# Patient Record
Sex: Male | Born: 1965 | Marital: Single | Smoking: Never smoker
Health system: Southern US, Community
[De-identification: ages and names within clinical notes are randomized; demographics above are authoritative.]

## PROBLEM LIST (undated history)

## (undated) DIAGNOSIS — R011 Cardiac murmur, unspecified: Secondary | ICD-10-CM

## (undated) DIAGNOSIS — M7711 Lateral epicondylitis, right elbow: Principal | ICD-10-CM

## (undated) HISTORY — PX: KNEE ARTHROPLASTY: SHX992

## (undated) HISTORY — PX: SHOULDER ARTHROSCOPY: SHX128

## (undated) HISTORY — DX: Cardiac murmur, unspecified: R01.1

## (undated) HISTORY — PX: WISDOM TOOTH EXTRACTION: SHX21

## (undated) HISTORY — PX: EYE SURGERY: SHX253

## (undated) HISTORY — PX: APPENDECTOMY: SHX54

## (undated) HISTORY — DX: Lateral epicondylitis, right elbow: M77.11

---

## 2013-02-27 ENCOUNTER — Encounter: Payer: Self-pay | Admitting: Gastroenterology

## 2013-03-15 ENCOUNTER — Ambulatory Visit: Payer: Self-pay | Admitting: Gastroenterology

## 2013-05-01 ENCOUNTER — Encounter: Payer: Self-pay | Admitting: Gastroenterology

## 2014-09-25 DIAGNOSIS — S83289A Other tear of lateral meniscus, current injury, unspecified knee, initial encounter: Secondary | ICD-10-CM | POA: Insufficient documentation

## 2015-04-01 ENCOUNTER — Telehealth: Payer: Self-pay | Admitting: *Deleted

## 2015-04-01 NOTE — Telephone Encounter (Signed)
Unable to reach patient at time of Pre-Visit Call.  Left message for patient to return call when available.    

## 2015-04-02 ENCOUNTER — Ambulatory Visit: Payer: Self-pay | Admitting: Medical

## 2015-04-03 ENCOUNTER — Encounter: Payer: Self-pay | Admitting: Medical

## 2015-04-03 ENCOUNTER — Ambulatory Visit (INDEPENDENT_AMBULATORY_CARE_PROVIDER_SITE_OTHER): Payer: BLUE CROSS/BLUE SHIELD | Admitting: Medical

## 2015-04-03 VITALS — BP 134/73 | HR 54 | Temp 97.9°F | Ht 69.75 in | Wt 183.2 lb

## 2015-04-03 DIAGNOSIS — T7840XA Allergy, unspecified, initial encounter: Secondary | ICD-10-CM | POA: Diagnosis not present

## 2015-04-03 MED ORDER — HYDROXYZINE HCL 25 MG PO TABS: 25.0000 mg | ORAL_TABLET | Freq: Three times a day (TID) | ORAL | Status: DC | PRN

## 2015-04-03 MED ORDER — AMMONIUM LACTATE 12 % EX LOTN: TOPICAL_LOTION | CUTANEOUS | Status: DC

## 2015-04-03 NOTE — Progress Notes (Signed)
Pre visit review using our clinic review tool, if applicable. No additional management support is needed unless otherwise documented below in the visit note. 

## 2015-04-03 NOTE — Progress Notes (Signed)
   Subjective:    Patient ID: Thomas Skinner, male    DOB: [birth year], 49 y.o.   MRN: [id number]  HPI  I have reviewed pt PMH, PSH, FH, Social History and Surgical History  Heart murmur- as a child. Not treated. Pt reports. Denies any caridiac or respirartory symptoms.   Eye surgery- Surgery to correct muscle. Did not work. Wears glasses. Appendectomy  Knee surgery- lt side acl repair.  Pt has some mild red pretibial rash on his leg. He was dong some yard work aroun the time this occurred. But also some rash in past with wearing boots in past. This has been on and off for couple of years. This is occuring year round. This occurs year round. And at times when skin is dry.  Pt upcoming out of town for 4 wks. Leaving next week.  [employer] social research officer, exercise gym, [employer] drinks 2 a day, married.     Review of Systems  Constitutional: Negative for fever, chills and fatigue.  Respiratory: Negative for cough, shortness of breath and wheezing.   Cardiovascular: Negative for chest pain and palpitations.  Gastrointestinal: Negative for abdominal pain.  Musculoskeletal: Negative for myalgias and back pain.  Skin: Positive for rash.       Itching.  Hematological: Negative for adenopathy. Does not bruise/bleed easily.    Past Medical History  Diagnosis Date  . Heart murmur     History   Social History  . Marital Status: Single    Spouse Name: N/A  . Number of Children: N/A  . Years of Education: N/A   Occupational History  . Not on file.   Social History Main Topics  . Smoking status: Never Smoker   . Smokeless tobacco: Never Used  . Alcohol Use: 0.0 oz/week    0 Standard drinks or equivalent per week     Comment: Rare occasinal one galss of wine.  . Drug Use: No  . Sexual Activity: Not on file   Other Topics Concern  . Not on file   Social History Narrative  . No narrative on file    Past Surgical History  Procedure Laterality Date  . Appendectomy    . Knee  arthroplasty      Left  . Eye surgery      No family history on file.  No Known Allergies  No current outpatient prescriptions on file prior to visit.   No current facility-administered medications on file prior to visit.    BP 134/73 mmHg  Pulse 54  Temp(Src) 97.9 F (36.6 C) (Oral)  Ht 5' 9.75" (1.772 m)  Wt 183 lb 3.2 oz (83.099 kg)  BMI 26.46 kg/m2  SpO2 100%       Objective:   Physical Exam   General- No acute distress. Pleasant patient. Neck- Full range of motion, no jvd Lungs- Clear, even and unlabored. Heart- regular rate and rhythm. Neurologic- CNII- XII grossly intact. Skin-  Lt side pretibial area faint pink rash. Mild dry skin. No warmth, no swelling. Linear red mark with faint scab above rash are.  Rt side- no obvious rash.         Assessment & Plan:

## 2015-04-03 NOTE — Assessment & Plan Note (Addendum)
Vs eczema type reaction. Moisturize with lac-hydrin. Use your triamcinolone. Will go ahead and try to get your in with dermatologist if possible within a week before you trip.   Hydroyzine for occasional severe itch.

## 2015-04-03 NOTE — Patient Instructions (Signed)
Allergic reaction Vs eczema type reaction. Moisturize with lac-hydrin. Use your triamcinolone. Will go ahead and try to get your in with dermatologist if possible within a week before you trip.   Hydroyzine for occasional severe itch.    Wellness  Within 2 months or as needed

## 2015-05-14 ENCOUNTER — Telehealth: Payer: Self-pay | Admitting: *Deleted

## 2015-05-14 NOTE — Telephone Encounter (Signed)
Unable to reach patient at time of Pre-Visit Call.  Left message for patient to return call when available.    

## 2015-05-15 ENCOUNTER — Encounter: Payer: Self-pay | Admitting: Medical

## 2015-05-15 ENCOUNTER — Ambulatory Visit (INDEPENDENT_AMBULATORY_CARE_PROVIDER_SITE_OTHER): Payer: BLUE CROSS/BLUE SHIELD | Admitting: Medical

## 2015-05-15 VITALS — BP 125/72 | HR 57 | Temp 98.9°F | Ht 69.75 in | Wt 182.6 lb

## 2015-05-15 DIAGNOSIS — N5089 Other specified disorders of the male genital organs: Secondary | ICD-10-CM

## 2015-05-15 DIAGNOSIS — Z0000 Encounter for general adult medical examination without abnormal findings: Secondary | ICD-10-CM | POA: Diagnosis not present

## 2015-05-15 DIAGNOSIS — N508 Other specified disorders of male genital organs: Secondary | ICD-10-CM

## 2015-05-15 LAB — POCT URINALYSIS DIPSTICK
Bilirubin, UA: NEGATIVE
Glucose, UA: NEGATIVE
Ketones, UA: NEGATIVE
Leukocytes, UA: NEGATIVE
NITRITE UA: NEGATIVE
PH UA: 5
PROTEIN UA: NEGATIVE
RBC UA: NEGATIVE
Spec Grav, UA: 1.015
Urobilinogen, UA: 0.2

## 2015-05-15 LAB — CBC WITH DIFFERENTIAL/PLATELET
Basophils Absolute: 0 10*3/uL (ref 0.0–0.1)
Basophils Relative: 0 % (ref 0–1)
Eosinophils Absolute: 0.2 10*3/uL (ref 0.0–0.7)
Eosinophils Relative: 3 % (ref 0–5)
HEMATOCRIT: 39.3 % (ref 39.0–52.0)
HEMOGLOBIN: 13.5 g/dL (ref 13.0–17.0)
Lymphocytes Relative: 26 % (ref 12–46)
Lymphs Abs: 1.8 10*3/uL (ref 0.7–4.0)
MCH: 33 pg (ref 26.0–34.0)
MCHC: 34.4 g/dL (ref 30.0–36.0)
MCV: 96.1 fL (ref 78.0–100.0)
MONOS PCT: 5 % (ref 3–12)
MPV: 9.9 fL (ref 8.6–12.4)
Monocytes Absolute: 0.4 10*3/uL (ref 0.1–1.0)
Neutro Abs: 4.6 10*3/uL (ref 1.7–7.7)
Neutrophils Relative %: 66 % (ref 43–77)
PLATELETS: 246 10*3/uL (ref 150–400)
RBC: 4.09 MIL/uL — ABNORMAL LOW (ref 4.22–5.81)
RDW: 12.9 % (ref 11.5–15.5)
WBC: 7 10*3/uL (ref 4.0–10.5)

## 2015-05-15 LAB — TSH: TSH: 1.639 u[IU]/mL (ref 0.350–4.500)

## 2015-05-15 MED ORDER — TETANUS-DIPHTH-ACELL PERTUSSIS 5-2.5-18.5 LF-MCG/0.5 IM SUSP
0.5000 mL | Freq: Once | INTRAMUSCULAR | Status: AC
  Administered 2015-05-15: 0.5 mL via INTRAMUSCULAR

## 2015-05-15 MED ORDER — FLUTICASONE PROPIONATE 50 MCG/ACT NA SUSP: 2.0000 | Freq: Every day | NASAL | Status: AC

## 2015-05-15 NOTE — Assessment & Plan Note (Signed)
tdap vaccine, cbc, cmp, tsh, lipid panel ua.

## 2015-05-15 NOTE — Progress Notes (Signed)
Subjective:    Patient ID: Thomas Skinner, male    DOB: [birth year], 49 y.o.   MRN: [id number]  HPI   I have reviewed pt PMH, PSH, FH, Social History and Surgical History  Government social research officer, exercise gym, [employer] 2 a day, married.  Pt appears to be late for Tdap. He will get today.  Pt advised to get flu vaccine in fall. He may decline  Pt agrees to doing labs. But declines HIV screen.  Pt states saw dermatologist 2 years ago. No suspicous lesions     Review of Systems  Constitutional: Negative for fever, chills, diaphoresis, activity change and fatigue.  HENT: Positive for congestion.        Nasal congestion since last visit. Runny nose.  Occasinal mild ear pressure sensation. Left ear.    Respiratory: Negative for cough, chest tightness and shortness of breath.   Cardiovascular: Negative for chest pain, palpitations and leg swelling.  Gastrointestinal: Negative for nausea, vomiting and abdominal pain.  Musculoskeletal: Negative for neck pain and neck stiffness.  Neurological: Negative for dizziness, tremors, seizures, syncope, facial asymmetry, speech difficulty, weakness, light-headedness, numbness and headaches.  Psychiatric/Behavioral: Negative for behavioral problems, confusion and agitation. The patient is not nervous/anxious.    Past Medical History  Diagnosis Date  . Heart murmur     History   Social History  . Marital Status: Single    Spouse Name: N/A  . Number of Children: N/A  . Years of Education: N/A   Occupational History  . Not on file.   Social History Main Topics  . Smoking status: Never Smoker   . Smokeless tobacco: Never Used  . Alcohol Use: 0.0 oz/week    0 Standard drinks or equivalent per week     Comment: Rare occasinal one galss of wine.  . Drug Use: No  . Sexual Activity: Not on file   Other Topics Concern  . Not on file   Social History Narrative    Past Surgical History  Procedure Laterality Date  . Appendectomy     . Knee arthroplasty      Left  . Eye surgery      No family history on file.  No Known Allergies  Current Outpatient Prescriptions on File Prior to Visit  Medication Sig Dispense Refill  . ammonium lactate (LAC-HYDRIN) 12 % lotion Apply to area twice day. (Patient not taking: Reported on 05/15/2015) 225 g 0  . hydrOXYzine (ATARAX/VISTARIL) 25 MG tablet Take 1 tablet (25 mg total) by mouth 3 (three) times daily as needed for itching. (Patient not taking: Reported on 05/15/2015) 21 tablet 0   No current facility-administered medications on file prior to visit.    BP 125/72 mmHg  Pulse 57  Temp(Src) 98.9 F (37.2 C) (Oral)  Ht 5' 9.75" (1.772 m)  Wt 182 lb 9.6 oz (82.827 kg)  BMI 26.38 kg/m2  SpO2 100%       Objective:   Physical Exam  General Mental Status- Alert. Orientation- Oriented x3.  Build and Nutrition- Well nourished and Well Developed.  Skin General:-Normal. Color- Normal color. Moisture- Normal. Temperature-Warm.  HEENT  Ears- Normal. Auditory Canal- Bilateral-Normal. Tympanic Membrane- Bilateral- Both mild red. Eye Fundi-Bilateral-Normal. Pupil- bilateral- Direct reaction to light normal. Nose & Sinuses-  Boggy turbinates. pnd. Nostrils-Bilateral- Normal. Mouth & Throat-Normal.  Neck Neck- No Bruits or Masses. Trachea midline.  Thyroid- Normal.  Chest and Lung Exam  Breath Sounds- Normal.  Adventitous Sounds:-No adventitious sounds.  Cardiovascular Inspection:- No  Heaves. Auscultation:-Normal sinus rhythm without murmur gallop, S1 WNL and S2 WNL.  Abdomen Inspection:-Inspection Normal. Inspection of the abdomen reveals- No hernias(pt feels like he has lump between rectus) Palpation/Percussion:- Palpation and Percussion of the Abdomen reveal- Non Tender and No Palpable abdominal masses. Liver: Other Characteristics- No hepatomegaly. Spleen:Other Characteristics- No Splenomegaly. Auscultation:- Auscultation of the abdomen reveals- Bowel sounds  normal and No Abdominal bruits.  Male Genitourinary Urethra:- No discharge. Penis- Circumcised. Scrotum- No masses. Testes- Bilateral-lt normal but epididymus mild enlarged faint tender  Rt side bottom aspect of testicle side tiny lump. Maybe appendice ot testicle    Peripheral  Vascular Lower Extremity:Inspection- Bilateral-Inspection Normal    Neurologic Mental Status:- Normal. Cranial Nerves:-Normal Bilaterally. Motor:-Normal. Strength:5/5 normal muscle strength-All Muscles. .  Musculoskeletal Global Assessment General-Joints show full range of motion without obvious deformity and Normal muscle mass. Strength in upper and lower extremities.  Lymphatics General lymphatics Description- No generalized lymphadenopathy.  Skin- very small scattered moles anterior thorax. No worrisome features      Assessment & Plan:

## 2015-05-15 NOTE — Progress Notes (Signed)
Pre visit review using our clinic review tool, if applicable. No additional management support is needed unless otherwise documented below in the visit note. 

## 2015-05-15 NOTE — Patient Instructions (Addendum)
Wellness examination tdap vaccine, cbc, cmp, tsh, lipid panel ua.    Our staff will call you with [place] date.  Rx flonase for nasal congestion.Likley allergies. If ear pain occurs notify us and give antibiotic.  Follow up date to be determined after lab results in.  Preventive Care for Adults A healthy lifestyle and preventive care can promote health and wellness. Preventive health guidelines for men include the following key practices:  A routine yearly physical is a good way to check with your health care provider about your health and preventative screening. It is a chance to share any concerns and updates on your health and to receive a thorough exam.  Visit your dentist for a routine exam and preventative care every 6 months. Brush your teeth twice a day and floss once a day. Good oral hygiene prevents tooth decay and gum disease.  The frequency of eye exams is based on your age, health, family medical history, use of contact lenses, and other factors. Follow your health care provider's recommendations for frequency of eye exams.  Eat a healthy diet. Foods such as vegetables, fruits, whole grains, low-fat dairy products, and lean protein foods contain the nutrients you need without too many calories. Decrease your intake of foods high in solid fats, added sugars, and salt. Eat the right amount of calories for you.Get information about a proper diet from your health care provider, if necessary.  Regular physical exercise is one of the most important things you can do for your health. Most adults should get at least 150 minutes of moderate-intensity exercise (any activity that increases your heart rate and causes you to sweat) each week. In addition, most adults need muscle-strengthening exercises on 2 or more days a week.  Maintain a healthy weight. The body mass index (BMI) is a screening tool to identify possible weight problems. It provides an estimate of body fat based on height and  weight. Your health care provider can find your BMI and can help you achieve or maintain a healthy weight.For adults 20 years and older:  A BMI below 18.5 is considered underweight.  A BMI of 18.5 to 24.9 is normal.  A BMI of 25 to 29.9 is considered overweight.  A BMI of 30 and above is considered obese.  Maintain normal blood lipids and cholesterol levels by exercising and minimizing your intake of saturated fat. Eat a balanced diet with plenty of fruit and vegetables. Blood tests for lipids and cholesterol should begin at age 51 and be repeated every 5 years. If your lipid or cholesterol levels are high, you are over 50, or you are at high risk for heart disease, you may need your cholesterol levels checked more frequently.Ongoing high lipid and cholesterol levels should be treated with medicines if diet and exercise are not working.  If you smoke, find out from your health care provider how to quit. If you do not use tobacco, do not start.  Lung cancer screening is recommended for adults aged 35-80 years who are at high risk for developing lung cancer because of a history of smoking. A yearly low-dose CT scan of the lungs is recommended for people who have at least a 30-pack-year history of smoking and are a current smoker or have quit within the past 15 years. A pack year of smoking is smoking an average of 1 pack of cigarettes a day for 1 year (for example: 1 pack a day for 30 years or 2 packs a day for 15  years). Yearly screening should continue until the smoker has stopped smoking for at least 15 years. Yearly screening should be stopped for people who develop a health problem that would prevent them from having lung cancer treatment.  If you choose to drink alcohol, do not have more than 2 drinks per day. One drink is considered to be 12 ounces (355 mL) of beer, 5 ounces (148 mL) of wine, or 1.5 ounces (44 mL) of liquor.  Avoid use of street drugs. Do not share needles with anyone. Ask  for help if you need support or instructions about stopping the use of drugs.  High blood pressure causes heart disease and increases the risk of stroke. Your blood pressure should be checked at least every 1-2 years. Ongoing high blood pressure should be treated with medicines, if weight loss and exercise are not effective.  If you are 8-75 years old, ask your health care provider if you should take aspirin to prevent heart disease.  Diabetes screening involves taking a blood sample to check your fasting blood sugar level. This should be done once every 3 years, after age 57, if you are within normal weight and without risk factors for diabetes. Testing should be considered at a younger age or be carried out more frequently if you are overweight and have at least 1 risk factor for diabetes.  Colorectal cancer can be detected and often prevented. Most routine colorectal cancer screening begins at the age of 66 and continues through age 8. However, your health care provider may recommend screening at an earlier age if you have risk factors for colon cancer. On a yearly basis, your health care provider may provide home test kits to check for hidden blood in the stool. Use of a small camera at the end of a tube to directly examine the colon (sigmoidoscopy or colonoscopy) can detect the earliest forms of colorectal cancer. Talk to your health care provider about this at age 70, when routine screening begins. Direct exam of the colon should be repeated every 5-10 years through age 11, unless early forms of precancerous polyps or small growths are found.  People who are at an increased risk for hepatitis B should be screened for this virus. You are considered at high risk for hepatitis B if:  You were born in a country where hepatitis B occurs often. Talk with your health care provider about which countries are considered high risk.  Your parents were born in a high-risk country and you have not received a  shot to protect against hepatitis B (hepatitis B vaccine).  You have HIV or AIDS.  You use needles to inject street drugs.  You live with, or have sex with, someone who has hepatitis B.  You are a man who has sex with other men (MSM).  You get hemodialysis treatment.  You take certain medicines for conditions such as cancer, organ transplantation, and autoimmune conditions.  Hepatitis C blood testing is recommended for all people born from [birth year] through 1965 and any individual with known risks for hepatitis C.  Practice safe sex. Use condoms and avoid high-risk sexual practices to reduce the spread of sexually transmitted infections (STIs). STIs include gonorrhea, chlamydia, syphilis, trichomonas, herpes, HPV, and human immunodeficiency virus (HIV). Herpes, HIV, and HPV are viral illnesses that have no cure. They can result in disability, cancer, and death.  If you are at risk of being infected with HIV, it is recommended that you take a prescription medicine daily to  prevent HIV infection. This is called preexposure prophylaxis (PrEP). You are considered at risk if:  You are a man who has sex with other men (MSM) and have other risk factors.  You are a heterosexual man, are sexually active, and are at increased risk for HIV infection.  You take drugs by injection.  You are sexually active with a partner who has HIV.  Talk with your health care provider about whether you are at high risk of being infected with HIV. If you choose to begin PrEP, you should first be tested for HIV. You should then be tested every 3 months for as long as you are taking PrEP.  A one-time screening for abdominal aortic aneurysm (AAA) and surgical repair of large AAAs by ultrasound are recommended for men ages 80 to 51 years who are current or former smokers.  Healthy men should no longer receive prostate-specific antigen (PSA) blood tests as part of routine cancer screening. Talk with your health care  provider about prostate cancer screening.  Testicular cancer screening is not recommended for adult males who have no symptoms. Screening includes self-exam, a health care provider exam, and other screening tests. Consult with your health care provider about any symptoms you have or any concerns you have about testicular cancer.  Use sunscreen. Apply sunscreen liberally and repeatedly throughout the day. You should seek shade when your shadow is shorter than you. Protect yourself by wearing long sleeves, pants, a wide-brimmed hat, and sunglasses year round, whenever you are outdoors.  Once a month, do a whole-body skin exam, using a mirror to look at the skin on your back. Tell your health care provider about new moles, moles that have irregular borders, moles that are larger than a pencil eraser, or moles that have changed in shape or color.  Stay current with required vaccines (immunizations).  Influenza vaccine. All adults should be immunized every year.  Tetanus, diphtheria, and acellular pertussis (Td, Tdap) vaccine. An adult who has not previously received Tdap or who does not know his vaccine status should receive 1 dose of Tdap. This initial dose should be followed by tetanus and diphtheria toxoids (Td) booster doses every 10 years. Adults with an unknown or incomplete history of completing a 3-dose immunization series with Td-containing vaccines should begin or complete a primary immunization series including a Tdap dose. Adults should receive a Td booster every 10 years.  Varicella vaccine. An adult without evidence of immunity to varicella should receive 2 doses or a second dose if he has previously received 1 dose.  Human papillomavirus (HPV) vaccine. Males aged 37-21 years who have not received the vaccine previously should receive the 3-dose series. Males aged 22-26 years may be immunized. Immunization is recommended through the age of 108 years for any male who has sex with males and  did not get any or all doses earlier. Immunization is recommended for any person with an immunocompromised condition through the age of 53 years if he did not get any or all doses earlier. During the 3-dose series, the second dose should be obtained 4-8 weeks after the first dose. The third dose should be obtained 24 weeks after the first dose and 16 weeks after the second dose.  Zoster vaccine. One dose is recommended for adults aged 83 years or older unless certain conditions are present.  Measles, mumps, and rubella (MMR) vaccine. Adults born before [birth year] generally are considered immune to measles and mumps. Adults born in [birth year] or later should have  1 or more doses of MMR vaccine unless there is a contraindication to the vaccine or there is laboratory evidence of immunity to each of the three diseases. A routine second dose of MMR vaccine should be obtained at least 28 days after the first dose for students attending postsecondary schools, health care workers, or international travelers. People who received inactivated measles vaccine or an unknown type of measles vaccine during 1963-1967 should receive 2 doses of MMR vaccine. People who received inactivated mumps vaccine or an unknown type of mumps vaccine before 1979 and are at high risk for mumps infection should consider immunization with 2 doses of MMR vaccine. Unvaccinated health care workers born before [birth year] who lack laboratory evidence of measles, mumps, or rubella immunity or laboratory confirmation of disease should consider measles and mumps immunization with 2 doses of MMR vaccine or rubella immunization with 1 dose of MMR vaccine.  Pneumococcal 13-valent conjugate (PCV13) vaccine. When indicated, a person who is uncertain of his immunization history and has no record of immunization should receive the PCV13 vaccine. An adult aged 68 years or older who has certain medical conditions and has not been previously immunized should receive 1 dose of  PCV13 vaccine. This PCV13 should be followed with a dose of pneumococcal polysaccharide (PPSV23) vaccine. The PPSV23 vaccine dose should be obtained at least 8 weeks after the dose of PCV13 vaccine. An adult aged 59 years or older who has certain medical conditions and previously received 1 or more doses of PPSV23 vaccine should receive 1 dose of PCV13. The PCV13 vaccine dose should be obtained 1 or more years after the last PPSV23 vaccine dose.  Pneumococcal polysaccharide (PPSV23) vaccine. When PCV13 is also indicated, PCV13 should be obtained first. All adults aged 28 years and older should be immunized. An adult younger than age 26 years who has certain medical conditions should be immunized. Any person who resides in a nursing home or long-term care facility should be immunized. An adult smoker should be immunized. People with an immunocompromised condition and certain other conditions should receive both PCV13 and PPSV23 vaccines. People with human immunodeficiency virus (HIV) infection should be immunized as soon as possible after diagnosis. Immunization during chemotherapy or radiation therapy should be avoided. Routine use of PPSV23 vaccine is not recommended for American Indians, [place] Natives, or people younger than 65 years unless there are medical conditions that require PPSV23 vaccine. When indicated, people who have unknown immunization and have no record of immunization should receive PPSV23 vaccine. One-time revaccination 5 years after the first dose of PPSV23 is recommended for people aged 19-64 years who have chronic kidney failure, nephrotic syndrome, asplenia, or immunocompromised conditions. People who received 1-2 doses of PPSV23 before age 26 years should receive another dose of PPSV23 vaccine at age 65 years or later if at least 5 years have passed since the previous dose. Doses of PPSV23 are not needed for people immunized with PPSV23 at or after age 27 years.  Meningococcal vaccine.  Adults with asplenia or persistent complement component deficiencies should receive 2 doses of quadrivalent meningococcal conjugate (MenACWY-D) vaccine. The doses should be obtained at least 2 months apart. Microbiologists working with certain meningococcal bacteria, [employer] recruits, people at risk during an outbreak, and people who travel to or live in countries with a high rate of meningitis should be immunized. A first-year college student up through age 61 years who is living in a residence hall should receive a dose if he did not receive a  dose on or after his 16th birthday. Adults who have certain high-risk conditions should receive one or more doses of vaccine.  Hepatitis A vaccine. Adults who wish to be protected from this disease, have certain high-risk conditions, work with hepatitis A-infected animals, work in hepatitis A research labs, or travel to or work in countries with a high rate of hepatitis A should be immunized. Adults who were previously unvaccinated and who anticipate close contact with an international adoptee during the first 60 days after arrival in the [place] from a country with a high rate of hepatitis A should be immunized.  Hepatitis B vaccine. Adults should be immunized if they wish to be protected from this disease, have certain high-risk conditions, may be exposed to blood or other infectious body fluids, are household contacts or sex partners of hepatitis B positive people, are clients or workers in certain care facilities, or travel to or work in countries with a high rate of hepatitis B.  Haemophilus influenzae type b (Hib) vaccine. A previously unvaccinated person with asplenia or sickle cell disease or having a scheduled splenectomy should receive 1 dose of Hib vaccine. Regardless of previous immunization, a recipient of a hematopoietic stem cell transplant should receive a 3-dose series 6-12 months after his successful transplant. Hib vaccine is not recommended  for adults with HIV infection. Preventive Service / Frequency Ages 34 to 62  Blood pressure check.** / Every 1 to 2 years.  Lipid and cholesterol check.** / Every 5 years beginning at age 29.  Hepatitis C blood test.** / For any individual with known risks for hepatitis C.  Skin self-exam. / Monthly.  Influenza vaccine. / Every year.  Tetanus, diphtheria, and acellular pertussis (Tdap, Td) vaccine.** / Consult your health care provider. 1 dose of Td every 10 years.  Varicella vaccine.** / Consult your health care provider.  HPV vaccine. / 3 doses over 6 months, if 71 or younger.  Measles, mumps, rubella (MMR) vaccine.** / You need at least 1 dose of MMR if you were born in 1957 or later. You may also need a second dose.  Pneumococcal 13-valent conjugate (PCV13) vaccine.** / Consult your health care provider.  Pneumococcal polysaccharide (PPSV23) vaccine.** / 1 to 2 doses if you smoke cigarettes or if you have certain conditions.  Meningococcal vaccine.** / 1 dose if you are age 13 to 79 years and a Market researcher living in a residence hall, or have one of several medical conditions. You may also need additional booster doses.  Hepatitis A vaccine.** / Consult your health care provider.  Hepatitis B vaccine.** / Consult your health care provider.  Haemophilus influenzae type b (Hib) vaccine.** / Consult your health care provider. Ages 40 to 31  Blood pressure check.** / Every 1 to 2 years.  Lipid and cholesterol check.** / Every 5 years beginning at age 98.  Lung cancer screening. / Every year if you are aged 63-80 years and have a 30-pack-year history of smoking and currently smoke or have quit within the past 15 years. Yearly screening is stopped once you have quit smoking for at least 15 years or develop a health problem that would prevent you from having lung cancer treatment.  Fecal occult blood test (FOBT) of stool. / Every year beginning at age 31 and  continuing until age 37. You may not have to do this test if you get a colonoscopy every 10 years.  Flexible sigmoidoscopy** or colonoscopy.** / Every 5 years for a flexible  sigmoidoscopy or every 10 years for a colonoscopy beginning at age 72 and continuing until age 81.  Hepatitis C blood test.** / For all people born from [birth year] through 1965 and any individual with known risks for hepatitis C.  Skin self-exam. / Monthly.  Influenza vaccine. / Every year.  Tetanus, diphtheria, and acellular pertussis (Tdap/Td) vaccine.** / Consult your health care provider. 1 dose of Td every 10 years.  Varicella vaccine.** / Consult your health care provider.  Zoster vaccine.** / 1 dose for adults aged 62 years or older.  Measles, mumps, rubella (MMR) vaccine.** / You need at least 1 dose of MMR if you were born in 1955 or later. You may also need a second dose.  Pneumococcal 13-valent conjugate (PCV13) vaccine.** / Consult your health care provider.  Pneumococcal polysaccharide (PPSV23) vaccine.** / 1 to 2 doses if you smoke cigarettes or if you have certain conditions.  Meningococcal vaccine.** / Consult your health care provider.  Hepatitis A vaccine.** / Consult your health care provider.  Hepatitis B vaccine.** / Consult your health care provider.  Haemophilus influenzae type b (Hib) vaccine.** / Consult your health care provider. Ages 23 and over  Blood pressure check.** / Every 1 to 2 years.  Lipid and cholesterol check.**/ Every 5 years beginning at age 47.  Lung cancer screening. / Every year if you are aged 63-80 years and have a 30-pack-year history of smoking and currently smoke or have quit within the past 15 years. Yearly screening is stopped once you have quit smoking for at least 15 years or develop a health problem that would prevent you from having lung cancer treatment.  Fecal occult blood test (FOBT) of stool. / Every year beginning at age 13 and continuing until age 39. You  may not have to do this test if you get a colonoscopy every 10 years.  Flexible sigmoidoscopy** or colonoscopy.** / Every 5 years for a flexible sigmoidoscopy or every 10 years for a colonoscopy beginning at age 12 and continuing until age 15.  Hepatitis C blood test.** / For all people born from [birth year] through 1963 and any individual with known risks for hepatitis C.  Abdominal aortic aneurysm (AAA) screening.** / A one-time screening for ages 6 to 9 years who are current or former smokers.  Skin self-exam. / Monthly.  Influenza vaccine. / Every year.  Tetanus, diphtheria, and acellular pertussis (Tdap/Td) vaccine.** / 1 dose of Td every 10 years.  Varicella vaccine.** / Consult your health care provider.  Zoster vaccine.** / 1 dose for adults aged 56 years or older.  Pneumococcal 13-valent conjugate (PCV13) vaccine.** / Consult your health care provider.  Pneumococcal polysaccharide (PPSV23) vaccine.** / 1 dose for all adults aged 43 years and older.  Meningococcal vaccine.** / Consult your health care provider.  Hepatitis A vaccine.** / Consult your health care provider.  Hepatitis B vaccine.** / Consult your health care provider.  Haemophilus influenzae type b (Hib) vaccine.** / Consult your health care provider. **Family history and personal history of risk and conditions may change your health care provider's recommendations. Document Released: 11/29/2001 Document Revised: 10/08/2013 Document Reviewed: 02/28/2011 [facility] Patient Information 2015 [place], [place]. This information is not intended to replace advice given to you by your health care provider. Make sure you discuss any questions you have with your health care provider.

## 2015-05-16 LAB — LIPID PANEL
Cholesterol: 156 mg/dL (ref 125–200)
HDL: 48 mg/dL (ref >=40)
LDL Cholesterol: 101 mg/dL (ref <130)
Total CHOL/HDL Ratio: 3.3 Ratio (ref <=5.0)
Triglycerides: 36 mg/dL (ref <150)
VLDL: 7 mg/dL (ref <30)

## 2015-05-16 LAB — COMPREHENSIVE METABOLIC PANEL
ALT: 37 U/L (ref 9–46)
AST: 50 U/L — AB (ref 10–40)
Albumin: 4.1 g/dL (ref 3.6–5.1)
Alkaline Phosphatase: 31 U/L — ABNORMAL LOW (ref 40–115)
BUN: 18 mg/dL (ref 7–25)
CHLORIDE: 101 mmol/L (ref 98–110)
CO2: 27 mmol/L (ref 20–31)
CREATININE: 1.18 mg/dL (ref 0.60–1.35)
Calcium: 8.9 mg/dL (ref 8.6–10.3)
Glucose, Bld: 81 mg/dL (ref 65–99)
POTASSIUM: 3.6 mmol/L (ref 3.5–5.3)
SODIUM: 139 mmol/L (ref 135–146)
Total Bilirubin: 1 mg/dL (ref 0.2–1.2)
Total Protein: 6.6 g/dL (ref 6.1–8.1)

## 2015-05-19 ENCOUNTER — Other Ambulatory Visit: Payer: Self-pay | Admitting: Medical

## 2015-05-19 DIAGNOSIS — N5089 Other specified disorders of the male genital organs: Secondary | ICD-10-CM

## 2015-05-20 ENCOUNTER — Ambulatory Visit (HOSPITAL_BASED_OUTPATIENT_CLINIC_OR_DEPARTMENT_OTHER)
Admission: RE | Admit: 2015-05-20 | Discharge: 2015-05-20 | Disposition: A | Discharge status: Home / Self Care | Payer: BLUE CROSS/BLUE SHIELD | Source: Ambulatory Visit | Attending: Medical | Admitting: Medical

## 2015-05-20 DIAGNOSIS — R938 Abnormal findings on diagnostic imaging of other specified body structures: Secondary | ICD-10-CM | POA: Insufficient documentation

## 2015-05-20 DIAGNOSIS — N503 Cyst of epididymis: Secondary | ICD-10-CM | POA: Diagnosis not present

## 2015-05-20 DIAGNOSIS — N433 Hydrocele, unspecified: Secondary | ICD-10-CM | POA: Insufficient documentation

## 2015-05-20 DIAGNOSIS — I861 Scrotal varices: Secondary | ICD-10-CM | POA: Insufficient documentation

## 2015-05-20 DIAGNOSIS — N5089 Other specified disorders of the male genital organs: Secondary | ICD-10-CM

## 2015-05-20 DIAGNOSIS — R229 Localized swelling, mass and lump, unspecified: Secondary | ICD-10-CM | POA: Diagnosis present

## 2015-07-03 ENCOUNTER — Telehealth: Payer: Self-pay | Admitting: Medical

## 2015-07-03 NOTE — Telephone Encounter (Signed)
Called pt and left him a message on his phone regarding the [place] report. He can call back and can discuss these with him. If any of symptoms in testicles can refer to urologist as sometimes these conditions can worse such as hydrocele enlarging.  If he calls next week pass me message and will try to call back. Will be in [place] next week.

## 2015-07-10 ENCOUNTER — Telehealth: Payer: Self-pay | Admitting: Medical

## 2015-07-10 NOTE — Telephone Encounter (Signed)
Edward please advise on what you would like me to do for this pt. Does he need to come back for an office visit?

## 2015-07-10 NOTE — Telephone Encounter (Signed)
What other check up should pt plan for? He wants to do everything appropriate for his age. This is not regarding results or labs or ultrasound. He wants to talk to [facility] about plan moving forward.  Best # [phone]

## 2015-07-16 NOTE — Telephone Encounter (Signed)
Let pt know that I did review his visit. The only thing I would recommend he get right now is a fluvaccine. Did not do it in the summer since was too early. But flu season is starting. I think our office already saw one positive. So recommend he go ahead and get flu vaccine.

## 2015-07-17 NOTE — Telephone Encounter (Signed)
Usually will have a visit with [place] before. Then we refer. Is that ok. If he really does not want to come in then get info on which knee, how long it has been hurting, mechanism and what he has tried for pain. Then I can refer. If he will come in then schedule convenient appointment. Avoid late day on Thursday.  Remind him about my chart

## 2015-07-17 NOTE — Telephone Encounter (Signed)
Spoke with pt and he states he does not want a flu shot at this time. Pt would like a referral to orthopedic surgeon due to knee pain. Please advise if pt needs to come in or you are able to put in a referral without seeing pt?

## 2015-07-17 NOTE — Telephone Encounter (Signed)
LDMOM for pt to call back to set up an appointment for Ortho referral next week Monday or Wednesday whatever day is better for him.

## 2015-09-09 ENCOUNTER — Telehealth: Payer: Self-pay | Admitting: Medical

## 2015-09-09 NOTE — Telephone Encounter (Signed)
Pt would like for you to give him a call states he is having some pain in right elbow, pt did not want to make an appt just yet wanted to speak with you first. Please call at [phone]

## 2015-09-13 NOTE — Telephone Encounter (Signed)
Did call pt and discussed his elbow pain. Recommended and asked pt to come in this week for evaluation and may get xray. He states will call tomorrow and try to schedule.

## 2015-09-14 ENCOUNTER — Ambulatory Visit (INDEPENDENT_AMBULATORY_CARE_PROVIDER_SITE_OTHER): Payer: BLUE CROSS/BLUE SHIELD | Admitting: Medical

## 2015-09-14 ENCOUNTER — Ambulatory Visit (HOSPITAL_BASED_OUTPATIENT_CLINIC_OR_DEPARTMENT_OTHER)
Admission: RE | Admit: 2015-09-14 | Discharge: 2015-09-14 | Disposition: A | Discharge status: Home / Self Care | Payer: BLUE CROSS/BLUE SHIELD | Source: Ambulatory Visit | Attending: Medical | Admitting: Medical

## 2015-09-14 ENCOUNTER — Encounter: Payer: Self-pay | Admitting: Medical

## 2015-09-14 VITALS — BP 126/86 | HR 60 | Temp 97.7°F | Ht 69.75 in | Wt 181.0 lb

## 2015-09-14 DIAGNOSIS — M25521 Pain in right elbow: Secondary | ICD-10-CM | POA: Diagnosis not present

## 2015-09-14 MED ORDER — DICLOFENAC SODIUM 75 MG PO TBEC: 75.0000 mg | DELAYED_RELEASE_TABLET | Freq: Two times a day (BID) | ORAL | Status: DC

## 2015-09-14 NOTE — Progress Notes (Signed)
Pre visit review using our clinic review tool, if applicable. No additional management support is needed unless otherwise documented below in the visit note. 

## 2015-09-14 NOTE — Progress Notes (Signed)
   Subjective:    Patient ID: Thomas Skinner, male    DOB: 1966/08/17, 49 y.o.   MRN: [id number]  HPI  Pt in for rt elbow for about 5 months. Severity on and off. He does a lot of work around his house doing projects. Using a lot of power tools. He has not been resting. Pt is right handed and pain worst over past month.  Pt also works out doing exercises.   Review of Systems  Constitutional: Negative for fever, chills, diaphoresis, activity change and fatigue.  Respiratory: Negative for cough, chest tightness and shortness of breath.   Cardiovascular: Negative for chest pain, palpitations and leg swelling.  Gastrointestinal: Negative for nausea, vomiting and abdominal pain.  Musculoskeletal: Negative for neck pain and neck stiffness.       Rt elbow pain. Lateral aspect.  Psychiatric/Behavioral: Negative for behavioral problems, confusion and agitation. The patient is not nervous/anxious.     Past Medical History  Diagnosis Date  . Heart murmur     Social History   Social History  . Marital Status: Single    Spouse Name: N/A  . Number of Children: N/A  . Years of Education: N/A   Occupational History  . Not on file.   Social History Main Topics  . Smoking status: Never Smoker   . Smokeless tobacco: Never Used  . Alcohol Use: 0.0 oz/week    0 Standard drinks or equivalent per week     Comment: Rare occasinal one galss of wine.  . Drug Use: No  . Sexual Activity: Not on file   Other Topics Concern  . Not on file   Social History Narrative    Past Surgical History  Procedure Laterality Date  . Appendectomy    . Knee arthroplasty      Left  . Eye surgery      No family history on file.  No Known Allergies  Current Outpatient Prescriptions on File Prior to Visit  Medication Sig Dispense Refill  . fluticasone (FLONASE) 50 MCG/ACT nasal spray Place 2 sprays into both nostrils daily. 16 g 1   No current facility-administered medications on file prior to  visit.    BP 126/86 mmHg  Pulse 60  Temp(Src) 97.7 F (36.5 C) (Oral)  Ht 5' 9.75" (1.772 m)  Wt 181 lb (82.101 kg)  BMI 26.15 kg/m2  SpO2 98%       Objective:   Physical Exam  General- No acute distress. Pleasant patient. Rt elbow-  Lateral epicondyle tender to palpation. Pain on pronation and supination. Some pain over forearm muscle as well.      Assessment & Plan:  Will get xray of rt elbow.  Get and use tennis elbow brace. Use daily. Massage area and ice twice daily.  Rx diclofenac.  Refer to sports medicine. Recommend resting for at least 5 days.  Follow up with [place] post sports medicine.

## 2015-09-14 NOTE — Patient Instructions (Signed)
Will get xray of rt elbow.  Get and use tennis elbow brace. Use daily. Massage area and ice twice daily.  Rx diclofenac.  Refer to sports medicine. Recommend resting for at least 5 days.  Follow up with [place] post sports medicine.

## 2015-09-15 ENCOUNTER — Encounter: Payer: Self-pay | Admitting: Family Medicine

## 2015-09-15 ENCOUNTER — Ambulatory Visit (INDEPENDENT_AMBULATORY_CARE_PROVIDER_SITE_OTHER): Payer: BLUE CROSS/BLUE SHIELD | Admitting: Family Medicine

## 2015-09-15 ENCOUNTER — Ambulatory Visit: Payer: BLUE CROSS/BLUE SHIELD | Admitting: Family Medicine

## 2015-09-15 VITALS — BP 112/72 | HR 54 | Ht 70.0 in | Wt 180.0 lb

## 2015-09-15 DIAGNOSIS — M7711 Lateral epicondylitis, right elbow: Secondary | ICD-10-CM | POA: Diagnosis not present

## 2015-09-15 NOTE — Patient Instructions (Signed)
You have lateral epicondylitis Ice the area 3-4 times a day for 15 minutes at a time. Tylenol or aleve as needed for pain. Counterforce brace or sleeve as directed can help unload area - wear this regularly if it provides you with relief. Hammer rotation exercise, wrist extension exercise with 1 pound weight - 3 sets of 10 once a day.   Stretching - hold for 20-30 seconds and repeat 3 times. Consider physical therapy, injection, nitro patches if not improving. Follow up in 6 weeks.

## 2015-09-16 ENCOUNTER — Encounter: Payer: Self-pay | Admitting: Family Medicine

## 2015-09-16 DIAGNOSIS — M7711 Lateral epicondylitis, right elbow: Secondary | ICD-10-CM

## 2015-09-16 HISTORY — DX: Lateral epicondylitis, right elbow: M77.11

## 2015-09-16 NOTE — Progress Notes (Signed)
PCP and consultation requested by: Mackie Pai, PA-C  Subjective:   HPI: Patient is a 49 y.o. male here for right elbow pain.  Patient reports having 5 months of lateral elbow pain. No known injury or trauma. Pain level 2/10, dull. No swelling. Recalls this starting after using a lot of power tools. No skin changes, fever, other complaints.  Past Medical History  Diagnosis Date  . Heart murmur     Current Outpatient Prescriptions on File Prior to Visit  Medication Sig Dispense Refill  . diclofenac (VOLTAREN) 75 MG EC tablet Take 1 tablet (75 mg total) by mouth 2 (two) times daily. 30 tablet 0  . fluticasone (FLONASE) 50 MCG/ACT nasal spray Place 2 sprays into both nostrils daily. 16 g 1   No current facility-administered medications on file prior to visit.    Past Surgical History  Procedure Laterality Date  . Appendectomy    . Knee arthroplasty      Left  . Eye surgery      No Known Allergies  Social History   Social History  . Marital Status: Single    Spouse Name: N/A  . Number of Children: N/A  . Years of Education: N/A   Occupational History  . Not on file.   Social History Main Topics  . Smoking status: Never Smoker   . Smokeless tobacco: Never Used  . Alcohol Use: 0.0 oz/week    0 Standard drinks or equivalent per week     Comment: Rare occasinal one galss of wine.  . Drug Use: No  . Sexual Activity: Not on file   Other Topics Concern  . Not on file   Social History Narrative    No family history on file.  BP 112/72 mmHg  Pulse 54  Ht 5\' 10"  (1.778 m)  Wt 180 lb (81.647 kg)  BMI 25.83 kg/m2  Review of Systems: See HPI above.    Objective:  Physical Exam:  Gen: NAD, comfortable in exam room.  Right elbow: No gross deformity, swelling, bruising. TTP lateral epicondyle and just distal to this. FROM with mild pain with wrist extension. Collateral ligaments intact. NVI distally.  Left elbow: FROM without pain.     Assessment & Plan:  1. Right lateral epicondylitis - shown home exercises to do daily.  Counterforce brace.  Icing, tylenol/nsaids.  Consider PT, injection, nitro patches if not improving.  F/u in 6 weeks.

## 2015-09-16 NOTE — Assessment & Plan Note (Signed)
shown home exercises to do daily.  Counterforce brace.  Icing, tylenol/nsaids.  Consider PT, injection, nitro patches if not improving.  F/u in 6 weeks.

## 2016-02-10 IMAGING — US US SCROTUM
1 series · 13 of 25 positions shown · non-contrast
Comparison: None.

CLINICAL DATA: Palpable nodule on right testicle, present for a
long time.

EXAM:
SCROTAL ULTRASOUND
DOPPLER ULTRASOUND OF THE TESTICLES
TECHNIQUE: Complete ultrasound examination of the testicles, epididymis, and
other scrotal structures was performed. Color and spectral Doppler
ultrasound were also utilized to evaluate blood flow to the
testicles.

[Series 1: us scrotum · 0.07mm/px · 13 of 32 slices shown]
[im 1/32]
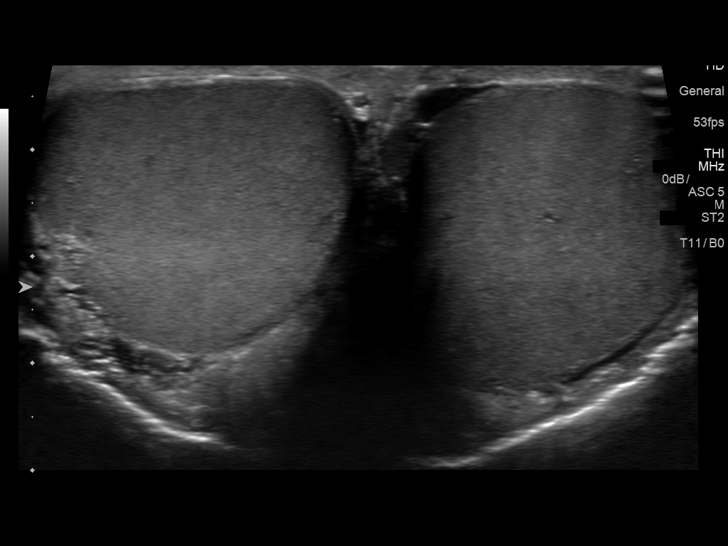
[im 3/32]
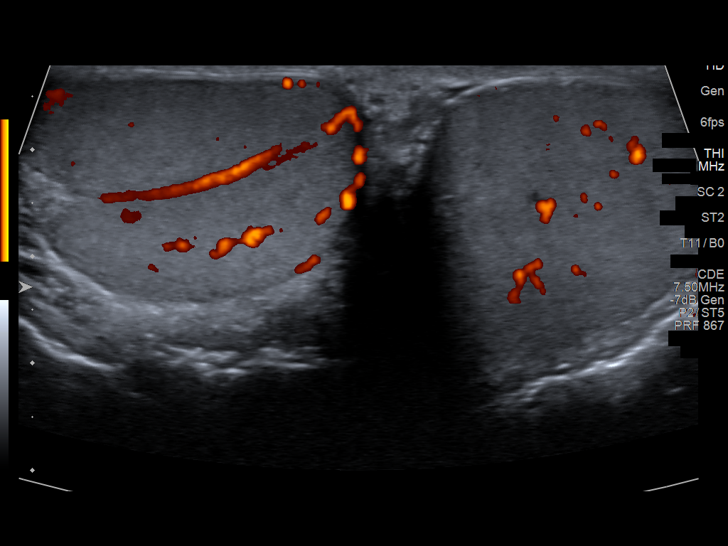
[im 6/32]
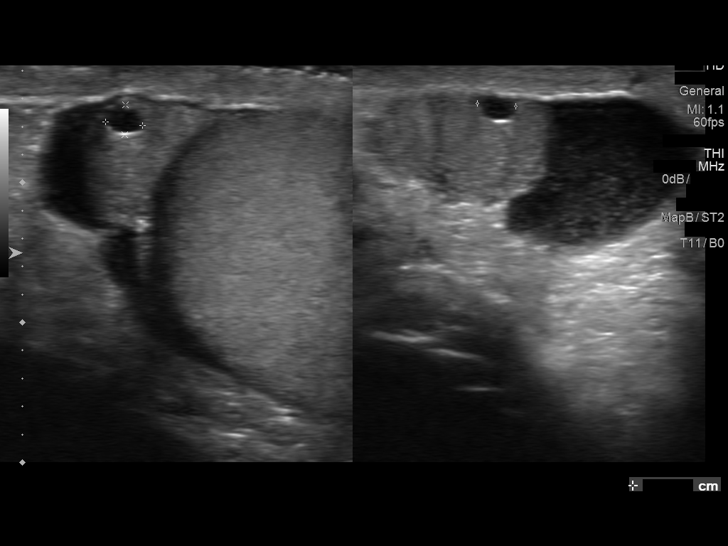
[im 8/32]
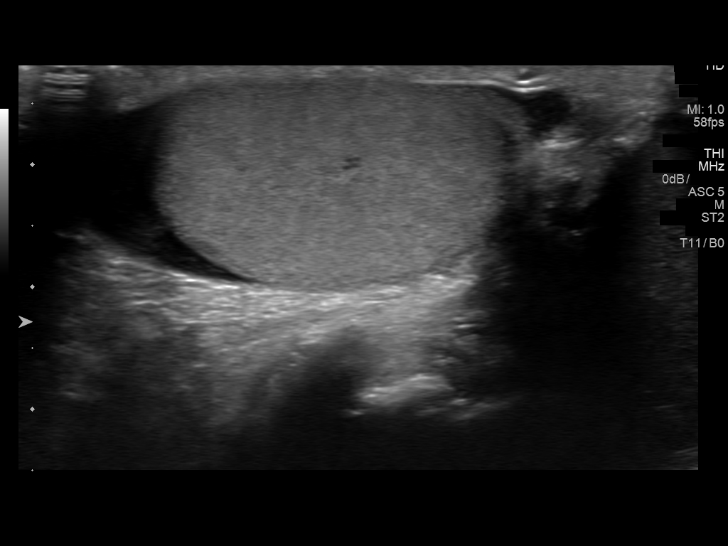
[im 11/32]
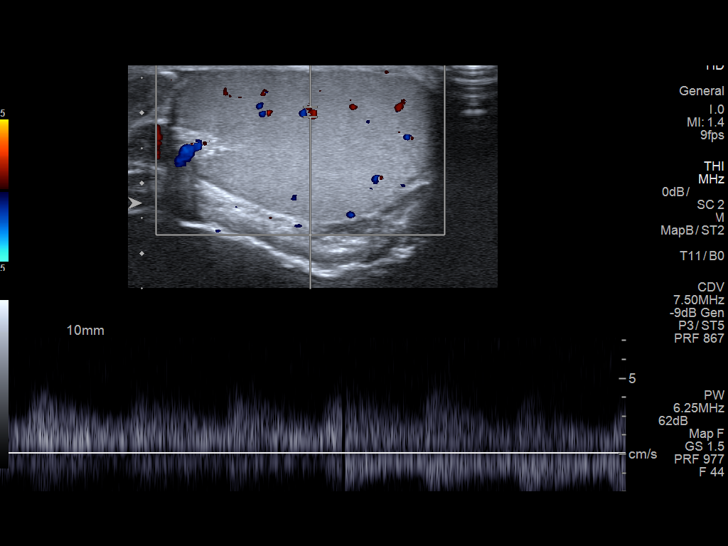
[im 13/32]
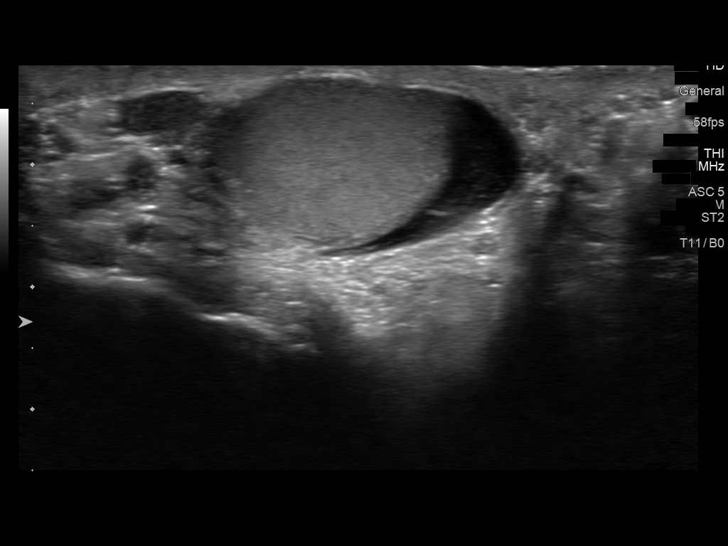
[im 16/32]
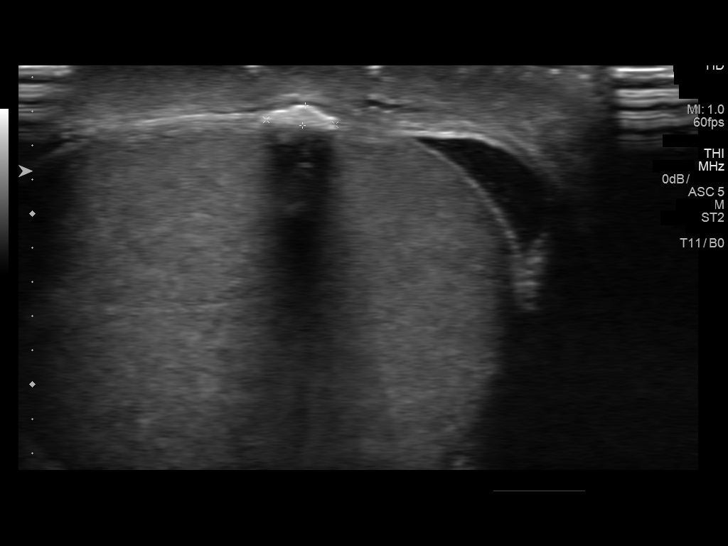
[im 19/32]
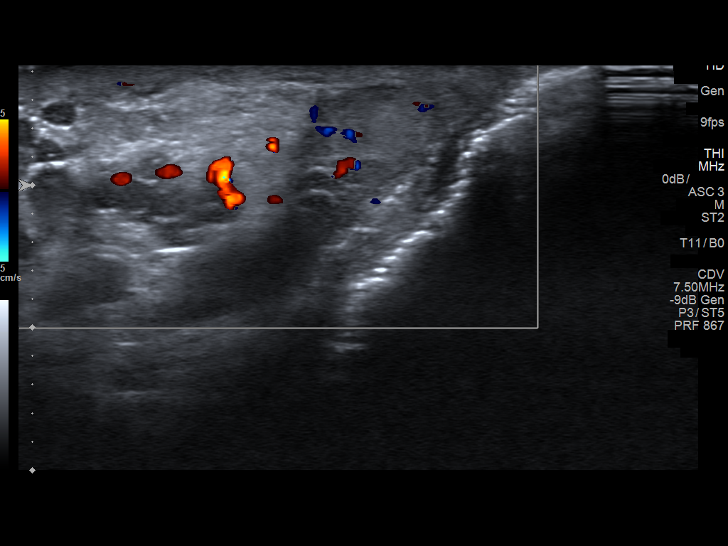
[im 21/32]
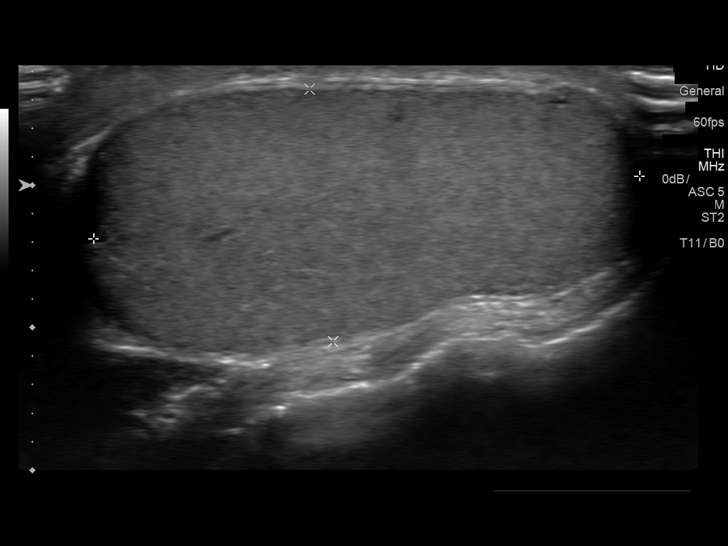
[im 24/32]
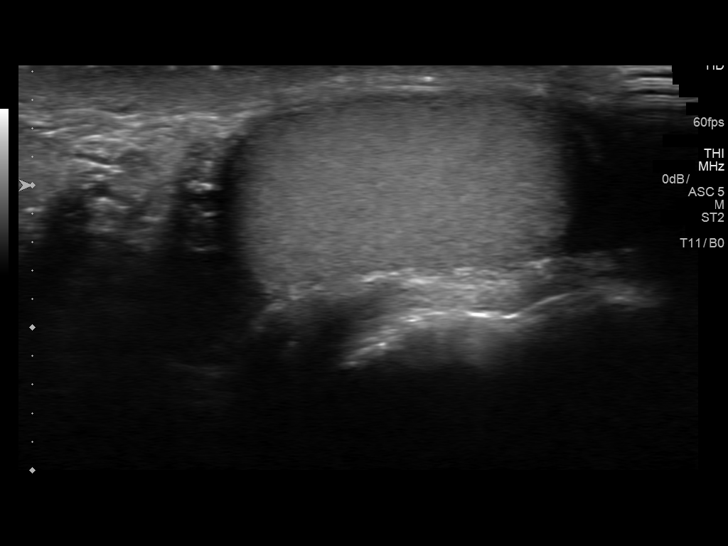
[im 26/32]
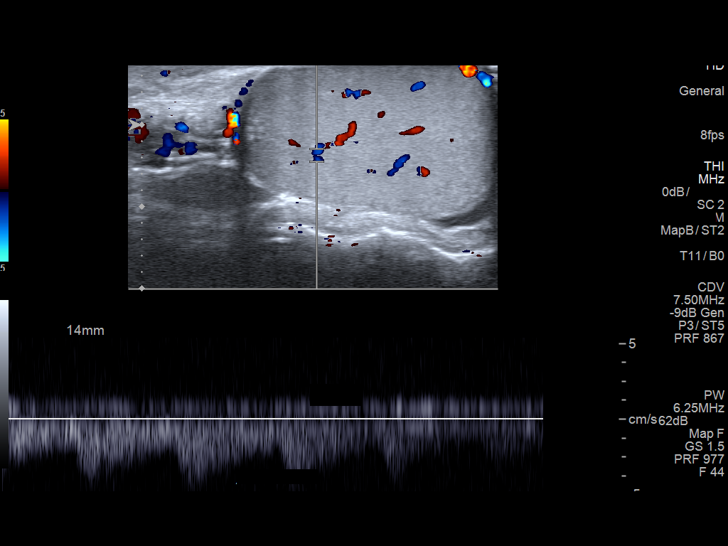
[im 29/32]
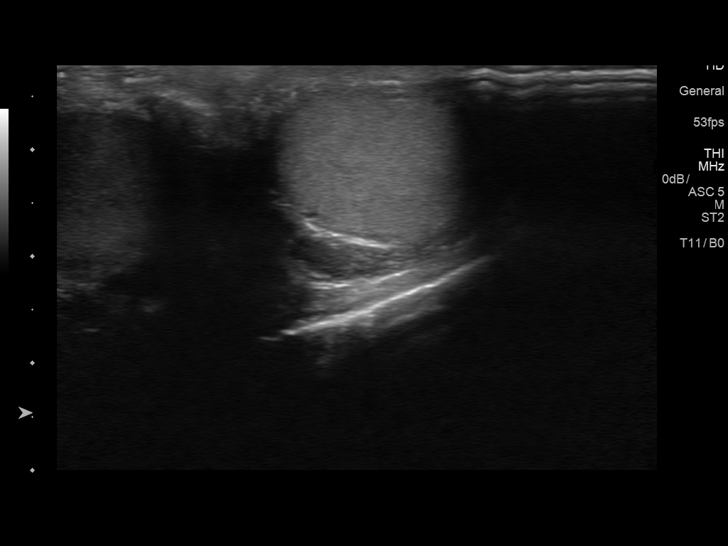
[im 32/32]
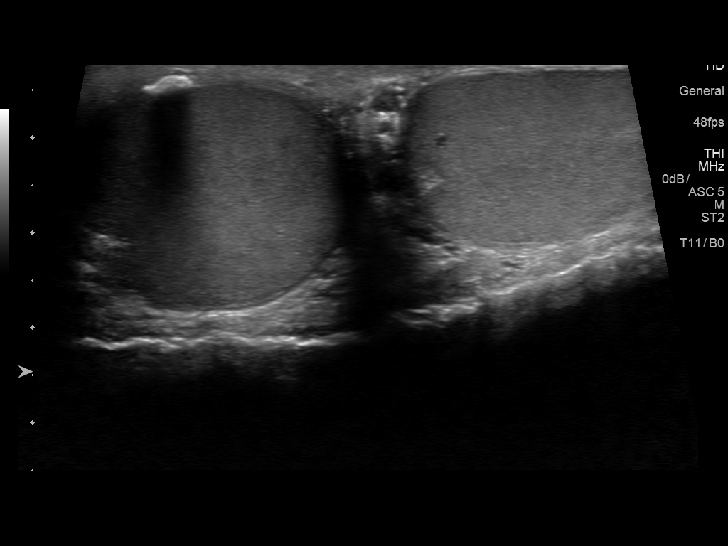

[13 of 25 positions shown; findings below may reference images not displayed]

FINDINGS: Right testicle

Measurements: 4.5 x 2.1 x 3.0 cm. No testicular mass or
microlithiasis visualized. There is a 5 x 4 x 1 mm hyperechoic
extratesticular lesion along the anterior aspect of the right
testicle which demonstrates posterior acoustic shadowing and appears
to be within the tunica albuginea.

Left testicle

Measurements: 3.9 x 1.8 x 2.8 cm. No mass or microlithiasis
visualized.

Right epididymis:  3 mm cyst.

Left epididymis:  2 cysts, the larger measuring 4 mm.

Hydrocele:  Small hydrocele on the right.

Varicocele: Medial left-sided varicocele with veins measuring up to
3.2 mm with Valsalva.

Pulsed Doppler interrogation of both testes demonstrates normal low
resistance arterial and venous waveforms bilaterally.
IMPRESSION: 1. 5 mm extratesticular calcification on the right which may reflect
a calcified tunica albuginea cyst or sequelae of prior trauma.
2. Unremarkable appearance of the testicles themselves.
3. Small bilateral epididymal cyst/spermatoceles.
4. Small right hydrocele.
5. Left varicocele.

## 2016-10-28 ENCOUNTER — Ambulatory Visit (INDEPENDENT_AMBULATORY_CARE_PROVIDER_SITE_OTHER): Payer: BLUE CROSS/BLUE SHIELD | Admitting: Medical

## 2016-10-28 ENCOUNTER — Ambulatory Visit: Payer: BLUE CROSS/BLUE SHIELD | Admitting: Medical

## 2016-10-28 ENCOUNTER — Encounter: Payer: Self-pay | Admitting: Medical

## 2016-10-28 VITALS — BP 110/72 | HR 64 | Temp 98.3°F | Resp 16 | Ht 70.0 in | Wt 179.5 lb

## 2016-10-28 DIAGNOSIS — Z0000 Encounter for general adult medical examination without abnormal findings: Secondary | ICD-10-CM | POA: Diagnosis not present

## 2016-10-28 NOTE — Progress Notes (Signed)
Subjective:    Patient ID: Dixie Dials, male    DOB: July 23, 1966, 51 y.o.   MRN: [id number]  HPI  Pt in for follow up.   Pt states his elbow is now better.(update on prior issue)  Pt has questions and concerns for general health based on fact that young person  who he knows about his same age just passed away with no known cause. So this caused concern for him.  Pt declines flu vaccine. Made aware of flu signs and symptoms that would indicate early evaluation. No stroke or heart attacks in family.  No colon cancer family history.  No prostate cancer amily  history.  Pt lives general healthy life stye. He does exercise regularly.  More than a year since last physical. No acute problem so I decided to go ahead and do physical.  Review of Systems  Constitutional: Negative for chills, fatigue and fever.  HENT: Negative for congestion and drooling.   Respiratory: Negative for cough, chest tightness, shortness of breath and wheezing.   Cardiovascular: Negative for chest pain and palpitations.  Gastrointestinal: Negative for abdominal pain, blood in stool, constipation, diarrhea, nausea and vomiting.  Genitourinary: Negative for decreased urine volume, difficulty urinating, dysuria, flank pain, frequency, hematuria, testicular pain and urgency.  Musculoskeletal:       Hx of left knee arthroscopic surgery.  Skin: Negative for pallor and rash.  Neurological: Negative for dizziness, seizures, light-headedness, numbness and headaches.  Hematological: Does not bruise/bleed easily.  Psychiatric/Behavioral: Negative for behavioral problems, confusion, hallucinations, sleep disturbance and suicidal ideas. The patient is not nervous/anxious.     Past Medical History:  Diagnosis Date  . Heart murmur   . Right lateral epicondylitis 09/16/2015     Social History   Social History  . Marital status: Single    Spouse name: N/A  . Number of children: N/A  . Years of education: N/A    Occupational History  . Not on file.   Social History Main Topics  . Smoking status: Never Smoker  . Smokeless tobacco: Never Used  . Alcohol use 0.0 oz/week     Comment: Rare occasinal one galss of wine.  . Drug use: No  . Sexual activity: Not on file   Other Topics Concern  . Not on file   Social History Narrative  . No narrative on file    Past Surgical History:  Procedure Laterality Date  . APPENDECTOMY    . EYE SURGERY    . KNEE ARTHROPLASTY     Left    No family history on file.  No Known Allergies  Current Outpatient Prescriptions on File Prior to Visit  Medication Sig Dispense Refill  . fluticasone (FLONASE) 50 MCG/ACT nasal spray Place 2 sprays into both nostrils daily. (Patient taking differently: Place 2 sprays into both nostrils daily as needed. ) 16 g 1   No current facility-administered medications on file prior to visit.     BP 110/72 (BP Location: Left Arm, Patient Position: Sitting, Cuff Size: Large)   Pulse 64   Temp 98.3 F (36.8 C) (Oral)   Resp 16   Ht 5\' 10"  (1.778 m)   Wt 179 lb 8 oz (81.4 kg)   SpO2 98%   BMI 25.76 kg/m       Objective:   Physical Exam  General Mental Status- Alert. General Appearance- Not in acute distress.   Skin General: Color- Normal Color. Moisture- Normal Moisture.(small scattered moles on inspsection. None suspicious)  Neck  Carotid Arteries- Normal color. Moisture- Normal Moisture. No carotid bruits. No JVD.  Chest and Lung Exam Auscultation: Breath Sounds:-Normal.  Cardiovascular Auscultation:Rythm- Regular. Murmurs & Other Heart Sounds:Auscultation of the heart reveals- No Murmurs.  Abdomen Inspection:-Inspeection Normal. Palpation/Percussion:Note:No mass. Palpation and Percussion of the abdomen reveal- Non Tender, Non Distended + BS, no rebound or guarding.  Neurologic Cranial Nerve exam:- CN III-XII intact(No nystagmus), symmetric smile. Strength:- 5/5 equal and symmetric strength both  upper and lower extremities.      Assessment & Plan:  For you wellness exam today I have ordered cbc, cmp, tsh, lipid panel, ua and hiv.(schedule your labs for next week since you are not fasting)  Declined flu vaccine today  Recommend exercise and healthy diet.(continue)  We will let you know lab results as they come in.  Follow up date appointment will be determined after lab review.   Anahla Bevis, Percell Miller, PA-C

## 2016-10-28 NOTE — Progress Notes (Signed)
Pre visit review using our clinic review tool, if applicable. No additional management support is needed unless otherwise documented below in the visit note/SLS Pt requested DOT Physical, informed him that we do not give DOT CPE, that he must go to Certified provider's office, usually via his Employer, understood & agreed/SLS 01/12

## 2016-10-28 NOTE — Patient Instructions (Addendum)
For you wellness exam today I have ordered cbc, cmp, tsh, lipid panel, ua and hiv.(schedule your labs for next week since you are not fasting)  Declined flu vaccine today  Recommend exercise and healthy diet.(continue)  We will let you know lab results as they come in.  Follow up date appointment will be determined after lab review.     Preventive Care 40-64 Years, Male Preventive care refers to lifestyle choices and visits with your health care provider that can promote health and wellness. What does preventive care include?  A yearly physical exam. This is also called an annual well check.  Dental exams once or twice a year.  Routine eye exams. Ask your health care provider how often you should have your eyes checked.  Personal lifestyle choices, including:  Daily care of your teeth and gums.  Regular physical activity.  Eating a healthy diet.  Avoiding tobacco and drug use.  Limiting alcohol use.  Practicing safe sex.  Taking low-dose aspirin every day starting at age 25. What happens during an annual well check? The services and screenings done by your health care provider during your annual well check will depend on your age, overall health, lifestyle risk factors, and family history of disease. Counseling  Your health care provider may ask you questions about your:  Alcohol use.  Tobacco use.  Drug use.  Emotional well-being.  Home and relationship well-being.  Sexual activity.  Eating habits.  Work and work Statistician. Screening  You may have the following tests or measurements:  Height, weight, and BMI.  Blood pressure.  Lipid and cholesterol levels. These may be checked every 5 years, or more frequently if you are over 7 years old.  Skin check.  Lung cancer screening. You may have this screening every year starting at age 38 if you have a 30-pack-year history of smoking and currently smoke or have quit within the past 15 years.  Fecal  occult blood test (FOBT) of the stool. You may have this test every year starting at age 10.  Flexible sigmoidoscopy or colonoscopy. You may have a sigmoidoscopy every 5 years or a colonoscopy every 10 years starting at age 41.  Prostate cancer screening. Recommendations will vary depending on your family history and other risks.  Hepatitis C blood test.  Hepatitis B blood test.  Sexually transmitted disease (STD) testing.  Diabetes screening. This is done by checking your blood sugar (glucose) after you have not eaten for a while (fasting). You may have this done every 1-3 years. Discuss your test results, treatment options, and if necessary, the need for more tests with your health care provider. Vaccines  Your health care provider may recommend certain vaccines, such as:  Influenza vaccine. This is recommended every year.  Tetanus, diphtheria, and acellular pertussis (Tdap, Td) vaccine. You may need a Td booster every 10 years.  Varicella vaccine. You may need this if you have not been vaccinated.  Zoster vaccine. You may need this after age 68.  Measles, mumps, and rubella (MMR) vaccine. You may need at least one dose of MMR if you were born in 1957 or later. You may also need a second dose.  Pneumococcal 13-valent conjugate (PCV13) vaccine. You may need this if you have certain conditions and have not been vaccinated.  Pneumococcal polysaccharide (PPSV23) vaccine. You may need one or two doses if you smoke cigarettes or if you have certain conditions.  Meningococcal vaccine. You may need this if you have certain conditions.  Hepatitis A vaccine. You may need this if you have certain conditions or if you travel or work in places where you may be exposed to hepatitis A.  Hepatitis B vaccine. You may need this if you have certain conditions or if you travel or work in places where you may be exposed to hepatitis B.  Haemophilus influenzae type b (Hib) vaccine. You may need this  if you have certain risk factors. Talk to your health care provider about which screenings and vaccines you need and how often you need them. This information is not intended to replace advice given to you by your health care provider. Make sure you discuss any questions you have with your health care provider. Document Released: 10/30/2015 Document Revised: 06/22/2016 Document Reviewed: 08/04/2015 Elsevier Interactive Patient Education  2017 [employer].

## 2016-11-04 ENCOUNTER — Other Ambulatory Visit: Payer: BLUE CROSS/BLUE SHIELD

## 2016-11-11 ENCOUNTER — Other Ambulatory Visit (INDEPENDENT_AMBULATORY_CARE_PROVIDER_SITE_OTHER): Payer: BLUE CROSS/BLUE SHIELD

## 2016-11-11 DIAGNOSIS — Z0000 Encounter for general adult medical examination without abnormal findings: Secondary | ICD-10-CM | POA: Diagnosis not present

## 2016-11-11 LAB — CBC WITH DIFFERENTIAL/PLATELET
Basophils Absolute: 0 10*3/uL (ref 0.0–0.1)
Basophils Relative: 0.8 % (ref 0.0–3.0)
Eosinophils Absolute: 0.2 10*3/uL (ref 0.0–0.7)
Eosinophils Relative: 3.4 % (ref 0.0–5.0)
HCT: 42.4 % (ref 39.0–52.0)
Hemoglobin: 14.9 g/dL (ref 13.0–17.0)
Lymphocytes Relative: 30.1 % (ref 12.0–46.0)
Lymphs Abs: 1.6 10*3/uL (ref 0.7–4.0)
MCHC: 35 g/dL (ref 30.0–36.0)
MCV: 96.2 fl (ref 78.0–100.0)
Monocytes Absolute: 0.4 10*3/uL (ref 0.1–1.0)
Monocytes Relative: 6.9 % (ref 3.0–12.0)
Neutro Abs: 3 10*3/uL (ref 1.4–7.7)
Neutrophils Relative %: 58.8 % (ref 43.0–77.0)
Platelets: 225 10*3/uL (ref 150.0–400.0)
RBC: 4.41 Mil/uL (ref 4.22–5.81)
RDW: 12.6 % (ref 11.5–15.5)
WBC: 5.2 10*3/uL (ref 4.0–10.5)

## 2016-11-11 LAB — COMPREHENSIVE METABOLIC PANEL
ALT: 25 U/L (ref 0–53)
AST: 20 U/L (ref 0–37)
Albumin: 4.5 g/dL (ref 3.5–5.2)
Alkaline Phosphatase: 28 U/L — ABNORMAL LOW (ref 39–117)
BILIRUBIN TOTAL: 0.7 mg/dL (ref 0.2–1.2)
BUN: 20 mg/dL (ref 6–23)
CALCIUM: 9.4 mg/dL (ref 8.4–10.5)
CO2: 29 meq/L (ref 19–32)
CREATININE: 1.32 mg/dL (ref 0.40–1.50)
Chloride: 107 mEq/L (ref 96–112)
GFR: 61.48 mL/min (ref >60.00)
GLUCOSE: 103 mg/dL — AB (ref 70–99)
Potassium: 3.8 mEq/L (ref 3.5–5.1)
Sodium: 142 mEq/L (ref 135–145)
Total Protein: 7.4 g/dL (ref 6.0–8.3)

## 2016-11-11 LAB — POC URINALSYSI DIPSTICK (AUTOMATED)
Bilirubin, UA: NEGATIVE
Blood, UA: NEGATIVE
Glucose, UA: NEGATIVE
Ketones, UA: NEGATIVE
Leukocytes, UA: NEGATIVE
Nitrite, UA: NEGATIVE
PH UA: 6
PROTEIN UA: NEGATIVE
SPEC GRAV UA: 1.02
UROBILINOGEN UA: NEGATIVE

## 2016-11-11 LAB — LIPID PANEL
CHOLESTEROL: 163 mg/dL (ref 0–200)
HDL: 51 mg/dL (ref >39.00)
LDL CALC: 104 mg/dL — AB (ref 0–99)
NonHDL: 112.04
Total CHOL/HDL Ratio: 3
Triglycerides: 41 mg/dL (ref 0.0–149.0)
VLDL: 8.2 mg/dL (ref 0.0–40.0)

## 2016-11-11 LAB — TSH: TSH: 2.03 u[IU]/mL (ref 0.35–4.50)

## 2016-11-12 LAB — HIV ANTIBODY (ROUTINE TESTING W REFLEX): HIV: NONREACTIVE

## 2016-11-21 ENCOUNTER — Encounter: Payer: Self-pay | Admitting: *Deleted

## 2016-11-21 NOTE — Progress Notes (Signed)
We have made attempts to reach patient by phone unsuccessfully; Letter mailed/SLS 02/05

## 2017-09-14 ENCOUNTER — Ambulatory Visit: Payer: BLUE CROSS/BLUE SHIELD | Admitting: Medical

## 2017-09-14 ENCOUNTER — Ambulatory Visit (HOSPITAL_BASED_OUTPATIENT_CLINIC_OR_DEPARTMENT_OTHER)
Admission: RE | Admit: 2017-09-14 | Discharge: 2017-09-14 | Disposition: A | Discharge status: Home / Self Care | Payer: BLUE CROSS/BLUE SHIELD | Source: Ambulatory Visit | Attending: Medical | Admitting: Medical

## 2017-09-14 ENCOUNTER — Encounter: Payer: Self-pay | Admitting: Medical

## 2017-09-14 VITALS — BP 120/70 | HR 62 | Temp 97.7°F | Resp 16 | Wt 182.0 lb

## 2017-09-14 DIAGNOSIS — G8929 Other chronic pain: Secondary | ICD-10-CM | POA: Diagnosis not present

## 2017-09-14 DIAGNOSIS — M25562 Pain in left knee: Secondary | ICD-10-CM | POA: Insufficient documentation

## 2017-09-14 DIAGNOSIS — M1712 Unilateral primary osteoarthritis, left knee: Secondary | ICD-10-CM | POA: Diagnosis not present

## 2017-09-14 DIAGNOSIS — M25522 Pain in left elbow: Secondary | ICD-10-CM

## 2017-09-14 DIAGNOSIS — R739 Hyperglycemia, unspecified: Secondary | ICD-10-CM | POA: Diagnosis not present

## 2017-09-14 DIAGNOSIS — Z9889 Other specified postprocedural states: Secondary | ICD-10-CM | POA: Insufficient documentation

## 2017-09-14 DIAGNOSIS — E785 Hyperlipidemia, unspecified: Secondary | ICD-10-CM

## 2017-09-14 LAB — COMPREHENSIVE METABOLIC PANEL
ALT: 31 U/L (ref 0–53)
AST: 23 U/L (ref 0–37)
Albumin: 4.3 g/dL (ref 3.5–5.2)
Alkaline Phosphatase: 31 U/L — ABNORMAL LOW (ref 39–117)
BUN: 26 mg/dL — AB (ref 6–23)
CHLORIDE: 104 meq/L (ref 96–112)
CO2: 30 mEq/L (ref 19–32)
CREATININE: 1.41 mg/dL (ref 0.40–1.50)
Calcium: 9.5 mg/dL (ref 8.4–10.5)
GFR: 56.77 mL/min — ABNORMAL LOW (ref >60.00)
GLUCOSE: 90 mg/dL (ref 70–99)
POTASSIUM: 3.9 meq/L (ref 3.5–5.1)
SODIUM: 139 meq/L (ref 135–145)
TOTAL PROTEIN: 7.4 g/dL (ref 6.0–8.3)
Total Bilirubin: 0.7 mg/dL (ref 0.2–1.2)

## 2017-09-14 LAB — HEMOGLOBIN A1C: Hgb A1c MFr Bld: 5.2 % (ref 4.6–6.5)

## 2017-09-14 LAB — LIPID PANEL
Cholesterol: 156 mg/dL (ref 0–200)
HDL: 47 mg/dL (ref >39.00)
LDL CALC: 101 mg/dL — AB (ref 0–99)
NonHDL: 109.12
Total CHOL/HDL Ratio: 3
Triglycerides: 43 mg/dL (ref 0.0–149.0)
VLDL: 8.6 mg/dL (ref 0.0–40.0)

## 2017-09-14 NOTE — Patient Instructions (Addendum)
For high cholesterol and mild high sugar in past will get cmp, lipid panel and a1c.  For elbow pain and knee pain will get xrays and refer you to sports medicine.  Flu vaccine declined.   Follow up date to be determined or as needed

## 2017-09-14 NOTE — Progress Notes (Signed)
   Subjective:    Patient ID: Thomas Skinner, male    DOB: [birth year], 51 y.o.   MRN: [id number]  HPI  Pt in for follow up.  Pt doin overall well. Pt states he has some elbow pain. States pain for 2 years. Difficulty when he curls. If does work out pain will be excessive.   Also some knee pain but he states hurt knee/acl skiing years ago.He had surgery for this in past. He had surgery to repair acl.(injury 2002 and surgery 2003)  States his walk is imbalanced and he states gait change due to left knee pain.  Hx of mild high ldl and on screening labs mild high sugar.        Review of Systems  Constitutional: Negative for chills, fatigue and fever.  Respiratory: Negative for cough, chest tightness, shortness of breath and wheezing.   Cardiovascular: Negative for chest pain and palpitations.  Gastrointestinal: Negative for abdominal pain, blood in stool, constipation and diarrhea.  Musculoskeletal:       Left elbow and left knee pain  Neurological: Negative for dizziness, seizures, speech difficulty, weakness and light-headedness.  Hematological: Negative for adenopathy. Does not bruise/bleed easily.  Psychiatric/Behavioral: Negative for behavioral problems and confusion.        Objective:   Physical Exam  General- No acute distress. Pleasant patient. Neck- Full range of motion, no jvd Lungs- Clear, even and unlabored. Heart- regular rate and rhythm. Neurologic- CNII- XII grossly intact.   Left elbow- deep pain in antecubital fossae on pronation, supination and flexion. Left knee- no pain on flexion on extension. But moderate pain on standing up/squatted position.        Assessment & Plan:  For high cholesterol and mild high sugar in past will get cmp, lipid panel and a1c.  For elbow pain and knee pain will get xrays and refer you to sports medicine.  Flu vaccine declined.   Follow up date to be determined or as needed  Stephanee Barcomb, Percell Miller, [employer]

## 2017-10-02 ENCOUNTER — Encounter: Payer: Self-pay | Admitting: Family Medicine

## 2017-10-02 ENCOUNTER — Ambulatory Visit: Payer: BLUE CROSS/BLUE SHIELD | Admitting: Family Medicine

## 2017-10-02 DIAGNOSIS — M25562 Pain in left knee: Secondary | ICD-10-CM | POA: Insufficient documentation

## 2017-10-02 DIAGNOSIS — G8929 Other chronic pain: Secondary | ICD-10-CM | POA: Diagnosis not present

## 2017-10-02 DIAGNOSIS — M25522 Pain in left elbow: Secondary | ICD-10-CM | POA: Diagnosis not present

## 2017-10-02 MED ORDER — NITROGLYCERIN 0.2 MG/HR TD PT24: MEDICATED_PATCH | TRANSDERMAL | 1 refills | Status: DC

## 2017-10-02 NOTE — Progress Notes (Signed)
PCP and consultation requested by: Mackie Pai, PA-C  Subjective:   HPI: Patient is a 51 y.o. male here for left elbow and knee pain.  Patient reports for about 1 1/2 years he's had anterior left elbow pain then a couple months ago when to grab something that was falling and felt sharp worsening of pain here. His pain is 1/10 but up to 7-8/10 at times, sharp. Arm will shake if tries to pick up items. No swelling, bruising. No injury initially. No numbness or tingling but feels weaker. History of problems with left knee - ACL reconstruction in 2002 and arthroscopy in 2015. Pain level 1/10 in knee but up to 10/10 and sharp at times medially and anteriorly. No skin changes, numbness.  Past Medical History:  Diagnosis Date  . Heart murmur   . Right lateral epicondylitis 09/16/2015    Current Outpatient Medications on File Prior to Visit  Medication Sig Dispense Refill  . fluticasone (FLONASE) 50 MCG/ACT nasal spray Place 2 sprays into both nostrils daily. (Patient taking differently: Place 2 sprays into both nostrils daily as needed. ) 16 g 1  . Multiple Vitamins-Minerals (PX MENS MULTIVITAMINS) TABS Take by mouth daily.    . Triamcinolone Acetonide (TRIAMCINOLONE 0.1 % CREAM : EUCERIN) CREA Apply 1 application topically 2 (two) times daily as needed. Eczema     No current facility-administered medications on file prior to visit.     Past Surgical History:  Procedure Laterality Date  . APPENDECTOMY    . EYE SURGERY    . KNEE ARTHROPLASTY     Left    No Known Allergies  Social History   Socioeconomic History  . Marital status: Single    Spouse name: Not on file  . Number of children: Not on file  . Years of education: Not on file  . Highest education level: Not on file  Social Needs  . Financial resource strain: Not on file  . Food insecurity - worry: Not on file  . Food insecurity - inability: Not on file  . Transportation needs - medical: Not on file  .  Transportation needs - non-medical: Not on file  Occupational History  . Not on file  Tobacco Use  . Smoking status: Never Smoker  . Smokeless tobacco: Never Used  Substance and Sexual Activity  . Alcohol use: Yes    Alcohol/week: 0.0 oz    Comment: Rare occasinal one galss of wine.  . Drug use: No  . Sexual activity: Not on file  Other Topics Concern  . Not on file  Social History Narrative  . Not on file    History reviewed. No pertinent family history.  BP 120/70   Pulse 71   Ht 5\' 10"  (1.778 m)   Wt 180 lb (81.6 kg)   BMI 25.83 kg/m   Review of Systems: See HPI above.     Objective:  Physical Exam:  Gen: NAD, comfortable in exam room  Left elbow: No deformity, swelling, bruising.  Fasciculations of medial bicep muscle. TTP distal biceps tendon and just lateral to this, deep. FROM with pain on elbow flexion and supination, mild.  5/5 strength. Collateral ligaments intact. Negative hook test. NVI distally.  Right elbow: FROM without pain.  Left knee: No gross deformity, ecchymoses, effusion.  Fasciculation of VMO. No TTP. FROM. Negative ant/post drawers. Negative valgus/varus testing. Negative lachmanns. Negative mcmurrays, apleys, patellar apprehension. NV intact distally.  Right knee: FROM without pain.   Assessment & Plan:  1.  Left elbow pain - 2/2 distal biceps tendinopathy.  Independently reviewed radiographs and no evidence OCD, fracture.  Ultrasound shows tendon is intact but some disorganization at deep portion of insertion suggesting prior partial tear.  Icing, shown home exercises to do daily but not to overdo these (mentioned he did 100 reps of curls with 10 pounds recently), nitro patches (discussed risks of headache, skin irritation).  Consider physical therapy.  2. Left knee pain - independently reviewed radiographs and mild DJD medially and patellofemoral compartment.  Reviewed home exercise program to target the VMO. Tylenol, topical  medications, supplements, nsaids reviewed to take if needed.  Consider injection if very severe.  Consider physical therapy.  F/u in 6 weeks.

## 2017-10-02 NOTE — Assessment & Plan Note (Signed)
2/2 distal biceps tendinopathy.  Independently reviewed radiographs and no evidence OCD, fracture.  Ultrasound shows tendon is intact but some disorganization at deep portion of insertion suggesting prior partial tear.  Icing, shown home exercises to do daily but not to overdo these (mentioned he did 100 reps of curls with 10 pounds recently), nitro patches (discussed risks of headache, skin irritation).  Consider physical therapy.

## 2017-10-02 NOTE — Patient Instructions (Signed)
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 Start nitro patches 1/4th patch to affected area, change daily. Ice 15 minutes at a time 3-4 times a day if needed. Ibuprofen, aleve unlikely to be helpful at this point but ok to take if needed. Biceps curls and hammer rotations with light weight 3 sets of 10 once a day at most. Consider physical therapy for this and other issues (left shoulder, hip, knee) - we can put in a referral for this if you want to do it. Consider compression sleeve.  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 Inside straight leg raises, knee extensions with ball squeeze, external rotation squats - 3 sets of 10 once a day of each of these. These are the different medications you can take for this: Tylenol 500mg  1-2 tabs three times a day for pain. Capsaicin, aspercreme, or biofreeze topically up to four times a day may also help with pain. Some supplements that may help for arthritis: Boswellia extract, curcumin, pycnogenol Aleve 1-2 tabs twice a day with food Cortisone injections are an option. If cortisone injections do not help, there are different types of shots that may help but they take longer to take effect. It's important that you continue to stay active. Consider physical therapy to strengthen muscles around the joint that hurts to take pressure off of the joint itself. Shoe inserts with good arch support may be helpful. Heat or ice 15 minutes at a time 3-4 times a day as needed to help with pain. Water aerobics and cycling with low resistance are the best two types of exercise for arthritis though any exercise is ok as long as it doesn't worsen the pain. Follow up with me in 6 weeks for both issues.

## 2017-10-02 NOTE — Assessment & Plan Note (Signed)
independently reviewed radiographs and mild DJD medially and patellofemoral compartment.  Reviewed home exercise program to target the VMO. Tylenol, topical medications, supplements, nsaids reviewed to take if needed.  Consider injection if very severe.  Consider physical therapy.  F/u in 6 weeks.

## 2017-10-06 ENCOUNTER — Ambulatory Visit: Payer: Self-pay | Admitting: *Deleted

## 2017-10-06 ENCOUNTER — Telehealth: Payer: Self-pay | Admitting: Medical

## 2017-10-06 NOTE — Telephone Encounter (Signed)
Copied from [place] [id number]. Topic: Quick Communication - Rx Refill/Question >> Oct 06, 2017 12:02 PM Arletha Grippe wrote: Has the patient contacted their pharmacy? No.   (Agent: If no, request that the patient contact the pharmacy for the refill.)   Preferred Pharmacy (with phone number or street name): pt has cold and would like to have medication called in. No fever, cough congestion. He does not want to make an appt.  Pharm is [facility] on [address]. Cb # [id number]   Agent: Please be advised that RX refills may take up to 3 business days. We ask that you follow-up with your pharmacy.

## 2017-10-06 NOTE — Telephone Encounter (Signed)
TC to patient to review symptoms and give advice. He reports mild cold symptoms so I reviewed home care advice with him. Reports he will follow advice. No further intervention is needed at this time. Reason for Disposition . Care advice for mild cough, questions about  Answer Assessment - Initial Assessment Questions 1. ONSET: "When did the nasal discharge start?"      clear 2. AMOUNT: "How much discharge is there?"      moderate 3. COUGH: "Do you have a cough?" If yes, ask: "Describe the color of your sputum" (clear, white, yellow, green)     Clear nasal discharge, cough 4. RESPIRATORY DISTRESS: "Describe your breathing."      normal 5. FEVER: "Do you have a fever?" If so, ask: "What is your temperature, how was it measured, and when did it start?"     no 6. SEVERITY: "Overall, how bad are you feeling right now?" (e.g., doesn't interfere with normal activities, staying home from school/work, staying in bed)     2  7. OTHER SYMPTOMS: "Do you have any other symptoms?" (e.g., sore throat, earache, wheezing, vomiting)    none 8. PREGNANCY: "Is there any chance you are pregnant?" "When was your last menstrual period?"    na  Protocols used: COMMON COLD-A-AH

## 2017-10-30 NOTE — Telephone Encounter (Signed)
See home care advice.

## 2018-07-16 DIAGNOSIS — M25562 Pain in left knee: Secondary | ICD-10-CM | POA: Diagnosis not present

## 2018-07-16 DIAGNOSIS — M7542 Impingement syndrome of left shoulder: Secondary | ICD-10-CM | POA: Diagnosis not present

## 2018-07-27 DIAGNOSIS — M25512 Pain in left shoulder: Secondary | ICD-10-CM | POA: Diagnosis not present

## 2018-07-27 DIAGNOSIS — M25562 Pain in left knee: Secondary | ICD-10-CM | POA: Diagnosis not present

## 2018-08-03 DIAGNOSIS — M25562 Pain in left knee: Secondary | ICD-10-CM | POA: Diagnosis not present

## 2018-12-19 DIAGNOSIS — M7542 Impingement syndrome of left shoulder: Secondary | ICD-10-CM | POA: Diagnosis not present

## 2018-12-19 DIAGNOSIS — M25562 Pain in left knee: Secondary | ICD-10-CM | POA: Diagnosis not present

## 2019-01-22 ENCOUNTER — Telehealth: Payer: Self-pay | Admitting: Medical

## 2019-01-22 NOTE — Telephone Encounter (Signed)
Pt of mine who I see at the gym when it was open. Let him know about virtual visits if he needs anything. Also he may want to schedule wellness exam/fasting in the morning late July or early august after viral pandemic situation resolves.

## 2019-03-21 DIAGNOSIS — M1712 Unilateral primary osteoarthritis, left knee: Secondary | ICD-10-CM | POA: Diagnosis not present

## 2019-03-21 DIAGNOSIS — S83232A Complex tear of medial meniscus, current injury, left knee, initial encounter: Secondary | ICD-10-CM | POA: Diagnosis not present

## 2019-04-05 DIAGNOSIS — M23252 Derangement of posterior horn of lateral meniscus due to old tear or injury, left knee: Secondary | ICD-10-CM | POA: Diagnosis not present

## 2019-04-05 DIAGNOSIS — M6281 Muscle weakness (generalized): Secondary | ICD-10-CM | POA: Diagnosis not present

## 2019-06-13 ENCOUNTER — Telehealth: Payer: Self-pay

## 2019-06-13 NOTE — Telephone Encounter (Signed)
Pt due for follow up 

## 2019-06-13 NOTE — Telephone Encounter (Signed)
LM to schedule f/u appt with Percell Miller

## 2019-06-17 DIAGNOSIS — M7542 Impingement syndrome of left shoulder: Secondary | ICD-10-CM | POA: Diagnosis not present

## 2019-09-26 DIAGNOSIS — M7542 Impingement syndrome of left shoulder: Secondary | ICD-10-CM | POA: Diagnosis not present

## 2019-09-26 DIAGNOSIS — M75112 Incomplete rotator cuff tear or rupture of left shoulder, not specified as traumatic: Secondary | ICD-10-CM | POA: Diagnosis not present

## 2019-09-26 DIAGNOSIS — G8918 Other acute postprocedural pain: Secondary | ICD-10-CM | POA: Diagnosis not present

## 2020-01-21 ENCOUNTER — Other Ambulatory Visit: Payer: Self-pay

## 2020-01-22 ENCOUNTER — Ambulatory Visit: Payer: BC Managed Care – PPO | Admitting: Medical

## 2020-01-22 ENCOUNTER — Other Ambulatory Visit: Payer: Self-pay

## 2020-01-22 ENCOUNTER — Ambulatory Visit (HOSPITAL_BASED_OUTPATIENT_CLINIC_OR_DEPARTMENT_OTHER)
Admission: RE | Admit: 2020-01-22 | Discharge: 2020-01-22 | Disposition: A | Discharge status: Home / Self Care | Payer: BC Managed Care – PPO | Source: Ambulatory Visit | Attending: Medical | Admitting: Medical

## 2020-01-22 ENCOUNTER — Telehealth: Payer: Self-pay | Admitting: Medical

## 2020-01-22 VITALS — BP 116/73 | HR 64 | Temp 98.3°F | Resp 18 | Wt 172.6 lb

## 2020-01-22 DIAGNOSIS — M79645 Pain in left finger(s): Secondary | ICD-10-CM

## 2020-01-22 DIAGNOSIS — S62617A Displaced fracture of proximal phalanx of left little finger, initial encounter for closed fracture: Secondary | ICD-10-CM | POA: Diagnosis not present

## 2020-01-22 DIAGNOSIS — S62637A Displaced fracture of distal phalanx of left little finger, initial encounter for closed fracture: Secondary | ICD-10-CM | POA: Diagnosis not present

## 2020-01-22 NOTE — Progress Notes (Addendum)
   Subjective:    Patient ID: Thomas Skinner, male    DOB: [birth year], 54 y.o.   MRN: [id number]  HPI  Pt in for finger pain.  He has been working from home.  Pt updates me that he got left shoulder surgery. Has not been able to work out since surgery.   Pt recently got left 5th digit injury. He slammed his left 5th digit tip under hood of his car.   Injury occurred one week ago.    Review of Systems     Objective:   Physical Exam   General- no acute distress. Left 5th digit- has mallet finger appearance/inability to fully extend at dip joint. Faint tender to palpation. No warmth, no bruising.      Assessment & Plan:  Based on your described mechanism of injury and exam I have concern for extender tendon injury.  Will get x-ray of left fifth digits now.  If no fracture seen recommend using over-the-counter splint to keep finger in extension position.  I placed order for hand specialist/surgeon to evaluate finger.  If you do not hear from them by early next week recommend that you call Dr. Biagio Borg office.  Also recommended to schedule for annual wellness exam/physical sometime late in the spring or early summer.  Follow-up as needed.  [facility] number [phone].  Time spent with patient today was 20    minutes which consisted of  discussing diagnosis, work up, treatment referral needed  and documentation.  Left message after hours explaining xray findings and asking him to call hand specialist office tomorrow. Other option sports medicine.

## 2020-01-22 NOTE — Telephone Encounter (Signed)
Open to review.  

## 2020-01-22 NOTE — Patient Instructions (Addendum)
Based on your described mechanism of injury and exam I have concern for extender tendon injury.  Will get x-ray of left fifth digits now.  If no fracture seen recommend using over-the-counter splint to keep finger in extension position.  I placed order for hand specialist/surgeon to evaluate finger.  If you do not hear from them by early next week recommend that you call Dr. Biagio Borg office.  Also recommended to schedule for annual wellness exam/physical sometime late in the spring or early summer.  Follow-up as needed.  [facility] number [phone].  Mallet Finger  Mallet finger is an injury that occurs when an object hits the tip of your straightened finger or thumb. It is also known as baseball finger. The blow to your fingertip causes it to bend more than normal, which tears the cord that attaches to the tip of your finger (extensor tendon).  Your extensor tendon is what straightens the end of your finger. If this tendon is damaged, you will not be able to straighten your fingertip. Sometimes, a piece of bone may be pulled away with the tendon (avulsion injury), or the tendon may tear completely. In some cases, surgery may be required to repair the damage. What are the causes? Mallet finger is caused by a hard, direct hit to the tip of your finger or thumb. This injury often happens from getting hit in the finger with a hard ball, such as a baseball. What increases the risk? This injury is more likely to happen if you play a sport that uses a hard ball. What are the signs or symptoms? The main symptom of this injury is the inability to straighten the tip of your finger. You can manually straighten your fingertip with your other hand, but the finger cannot straighten on its own. Other symptoms may include:  Pain.  Swelling.  Bruising.  Blood under the fingernail. How is this diagnosed? Your health care provider may suspect mallet finger if you are not able to extend your  fingertip, especially if you recently injured your hand. Your health care provider will do a physical exam. This may include X-rays to see if a piece of bone has been pulled away or if the finger joint has separated (dislocated). How is this treated? Mallet finger may be treated with:  A splint on your fingertip to keep it straight (extended) while the tendon heals.  Surgery to repair the tendon. This is done in severe cases. This may involve: ? Using a pin or screw to keep your finger extended and your tendon attached. ? Using a piece of tendon from another part of your body (graft) to replace a torn tendon. Follow these instructions at home: If you have a splint:  Wear the splint as told by your health care provider. Remove it only as told by your health care provider.  Loosen the splint if your fingers tingle, become numb, or turn cold and blue.  Keep the splint clean.  If the splint is not waterproof: ? Do not let it get wet. ? Cover it with a watertight covering when you take a bath or a shower.  If you take your splint off to dry it or change it: ? Gently press your finger on a flat surface to keep it straight. Failing to do so may lead to a permanent injury, or force you to wear the splint for a longer period of time. ? Check the skin under the splint. Tell your health care provider if you  notice a blister or red and raw skin. Managing pain, stiffness, and swelling   If directed, put ice on the injured area: ? If you have a removable splint, remove it as told by your health care provider. ? Put ice in a plastic bag. ? Place a towel between your skin and the bag. ? Leave the ice on for 20 minutes, 2-3 times a day.  Move your fingers often to avoid stiffness and to lessen swelling.  Raise (elevate)the injured hand above the level of your heart while you are sitting or lying down. General instructions  Take over-the-counter and prescription medicines only as told by your  health care provider.  Do not drive or use heavy machinery while taking prescription pain medicine.  Keep all follow-up visits as told by your health care provider. This is important. Contact a health care provider if:  You have pain or swelling that is getting worse.  Your finger feels cold.  You cannot extend your finger after treatment.  You notice that the skin under the splint is red, raw, or has a blister. Get help right away if:  Even after loosening your splint, your finger is: ? Very red and swollen. ? White or blue. ? Numb or tingling. Summary  Mallet finger is an injury that occurs from a hard, direct hit to the tip of your finger or thumb.  The blow to your fingertip causes it to bend more than normal, tearing the tendon that straightens the end of your finger. You cannot straighten your fingertip if this tendon is torn.  This injury often happens from getting hit in the finger with a hard ball, such as a baseball.  Treatment will depend on how severe the injury is. You may need to wear a splint to keep the finger straight while it heals. A more severe injury may require surgery to repair the tendon. This information is not intended to replace advice given to you by your health care provider. Make sure you discuss any questions you have with your health care provider. Document Revised: 10/16/2017 Document Reviewed: 10/16/2017 Elsevier Patient Education  2020 [employer].

## 2020-01-24 ENCOUNTER — Other Ambulatory Visit: Payer: Self-pay

## 2020-01-24 DIAGNOSIS — S62669A Nondisplaced fracture of distal phalanx of unspecified finger, initial encounter for closed fracture: Secondary | ICD-10-CM

## 2020-01-24 DIAGNOSIS — M79645 Pain in left finger(s): Secondary | ICD-10-CM

## 2020-01-24 NOTE — Progress Notes (Signed)
Patient advised of results, he will like to see sports medicine. Referral entered.

## 2020-01-28 ENCOUNTER — Other Ambulatory Visit: Payer: Self-pay

## 2020-01-28 ENCOUNTER — Ambulatory Visit: Payer: BC Managed Care – PPO | Admitting: Family Medicine

## 2020-01-28 ENCOUNTER — Encounter: Payer: Self-pay | Admitting: Family Medicine

## 2020-01-28 VITALS — BP 113/70 | HR 50 | Ht 70.0 in | Wt 173.0 lb

## 2020-01-28 DIAGNOSIS — M20012 Mallet finger of left finger(s): Secondary | ICD-10-CM | POA: Insufficient documentation

## 2020-01-28 NOTE — Patient Instructions (Signed)
Nice to meet you Please keep the area splinted straight   Please send me a message in [place] with any questions or updates.  Please see me back in 3 weeks.   --Dr. Raeford Razor

## 2020-01-28 NOTE — Progress Notes (Signed)
Thomas Skinner - 54 y.o. male MRN [id number]  Date of birth: 01/30/1966  SUBJECTIVE:  Including CC & ROS.  Chief Complaint  Patient presents with  . Finger Injury    left pinky finger x 1 week    Thomas Skinner is a 54 y.o. male that is presenting with left fifth finger pain.  He was working on a car and the hood fell and smashed his finger.  He has had swelling of the DIP since that time.  Denies any numbness or tingling..  Independent review of the left finger x-ray from 4/7 shows a avulsion fracture of the distal phalanx   Review of Systems See HPI   HISTORY: Past Medical, Surgical, Social, and Family History Reviewed & Updated per EMR.   Pertinent Historical Findings include:  Past Medical History:  Diagnosis Date  . Heart murmur   . Right lateral epicondylitis 09/16/2015    Past Surgical History:  Procedure Laterality Date  . APPENDECTOMY    . EYE SURGERY    . KNEE ARTHROPLASTY     Left    No family history on file.  Social History   Socioeconomic History  . Marital status: Single    Spouse name: Not on file  . Number of children: Not on file  . Years of education: Not on file  . Highest education level: Not on file  Occupational History  . Not on file  Tobacco Use  . Smoking status: Never Smoker  . Smokeless tobacco: Never Used  Substance and Sexual Activity  . Alcohol use: Yes    Alcohol/week: 0.0 standard drinks    Comment: Rare occasinal one galss of wine.  . Drug use: No  . Sexual activity: Not on file  Other Topics Concern  . Not on file  Social History Narrative  . Not on file   Social Determinants of Health   Financial Resource Strain:   . Difficulty of Paying Living Expenses:   Food Insecurity:   . Worried About Charity fundraiser in the Last Year:   . Arboriculturist in the Last Year:   Transportation Needs:   . Film/video editor (Medical):   Marland Kitchen Lack of Transportation (Non-Medical):   Physical Activity:   . Days of Exercise per  Week:   . Minutes of Exercise per Session:   Stress:   . Feeling of Stress :   Social Connections:   . Frequency of Communication with Friends and Family:   . Frequency of Social Gatherings with Friends and Family:   . Attends Religious Services:   . Active Member of Clubs or Organizations:   . Attends Archivist Meetings:   Marland Kitchen Marital Status:   Intimate Partner Violence:   . Fear of Current or Ex-Partner:   . Emotionally Abused:   Marland Kitchen Physically Abused:   . Sexually Abused:      PHYSICAL EXAM:  VS: BP 113/70   Pulse (!) 50   Ht 5\' 10"  (1.778 m)   Wt 173 lb (78.5 kg)   BMI 24.82 kg/m  Physical Exam Gen: NAD, alert, cooperative with exam, well-appearing MSK:  Left hand: Swelling and redness of the distal phalanx of the fifth digit. No volar subluxation of the distal phalanx of the fifth digit. Passive extension and flexion is intact at the DIP of the fifth digit. Neurovascularly intact     ASSESSMENT & PLAN:   Mallet finger of left hand Injury occurred over a week ago but  has been intermittently splinting during that time. -Sam splint. -Counseled on maintaining extension in and out of the brace. -Follow-up in 3 weeks.

## 2020-01-28 NOTE — Assessment & Plan Note (Signed)
Injury occurred over a week ago but has been intermittently splinting during that time. -Sam splint. -Counseled on maintaining extension in and out of the brace. -Follow-up in 3 weeks.

## 2020-02-01 DIAGNOSIS — Z9889 Other specified postprocedural states: Secondary | ICD-10-CM | POA: Diagnosis not present

## 2020-02-01 DIAGNOSIS — M20019 Mallet finger of unspecified finger(s): Secondary | ICD-10-CM | POA: Diagnosis not present

## 2020-02-04 ENCOUNTER — Telehealth: Payer: Self-pay | Admitting: Family Medicine

## 2020-02-04 DIAGNOSIS — M20012 Mallet finger of left finger(s): Secondary | ICD-10-CM

## 2020-02-04 DIAGNOSIS — M20019 Mallet finger of unspecified finger(s): Secondary | ICD-10-CM | POA: Diagnosis not present

## 2020-02-04 NOTE — Telephone Encounter (Signed)
Left VM for patient. If he calls back please have him speak with a nurse/CMA and inform that I am sending a referral to occupational therapy to make a custom splint. They should call him for an appointment. He should let us know if he doesn't hear from them.   If any questions then please take the best time and phone number to call and I will try to call him back.   Rosemarie Ax, MD [facility] 02/04/2020, 4:13 PM

## 2020-02-18 ENCOUNTER — Ambulatory Visit: Payer: BC Managed Care – PPO | Admitting: Family Medicine

## 2020-02-18 NOTE — Progress Notes (Deleted)
  Thomas Skinner - 54 y.o. male MRN [id number]  Date of birth: Mar 07, 1966  SUBJECTIVE:  Including CC & ROS.  No chief complaint on file.   Thomas Skinner is a 54 y.o. male that is  ***.  ***   Review of Systems See HPI   HISTORY: Past Medical, Surgical, Social, and Family History Reviewed & Updated per EMR.   Pertinent Historical Findings include:  Past Medical History:  Diagnosis Date  . Heart murmur   . Right lateral epicondylitis 09/16/2015    Past Surgical History:  Procedure Laterality Date  . APPENDECTOMY    . EYE SURGERY    . KNEE ARTHROPLASTY     Left    No family history on file.  Social History   Socioeconomic History  . Marital status: Single    Spouse name: Not on file  . Number of children: Not on file  . Years of education: Not on file  . Highest education level: Not on file  Occupational History  . Not on file  Tobacco Use  . Smoking status: Never Smoker  . Smokeless tobacco: Never Used  Substance and Sexual Activity  . Alcohol use: Yes    Alcohol/week: 0.0 standard drinks    Comment: Rare occasinal one galss of wine.  . Drug use: No  . Sexual activity: Not on file  Other Topics Concern  . Not on file  Social History Narrative  . Not on file   Social Determinants of Health   Financial Resource Strain:   . Difficulty of Paying Living Expenses:   Food Insecurity:   . Worried About Charity fundraiser in the Last Year:   . Arboriculturist in the Last Year:   Transportation Needs:   . Film/video editor (Medical):   Marland Kitchen Lack of Transportation (Non-Medical):   Physical Activity:   . Days of Exercise per Week:   . Minutes of Exercise per Session:   Stress:   . Feeling of Stress :   Social Connections:   . Frequency of Communication with Friends and Family:   . Frequency of Social Gatherings with Friends and Family:   . Attends Religious Services:   . Active Member of Clubs or Organizations:   . Attends Archivist Meetings:    Marland Kitchen Marital Status:   Intimate Partner Violence:   . Fear of Current or Ex-Partner:   . Emotionally Abused:   Marland Kitchen Physically Abused:   . Sexually Abused:      PHYSICAL EXAM:  VS: There were no vitals taken for this visit. Physical Exam Gen: NAD, alert, cooperative with exam, well-appearing MSK:  ***      ASSESSMENT & PLAN:   No problem-specific Assessment & Plan notes found for this encounter.

## 2020-03-05 DIAGNOSIS — M20019 Mallet finger of unspecified finger(s): Secondary | ICD-10-CM | POA: Diagnosis not present

## 2020-03-05 DIAGNOSIS — M20012 Mallet finger of left finger(s): Secondary | ICD-10-CM | POA: Diagnosis not present

## 2020-04-07 DIAGNOSIS — M20019 Mallet finger of unspecified finger(s): Secondary | ICD-10-CM | POA: Diagnosis not present

## 2020-09-08 ENCOUNTER — Ambulatory Visit: Payer: BC Managed Care – PPO | Admitting: Medical

## 2020-09-08 ENCOUNTER — Other Ambulatory Visit: Payer: Self-pay

## 2020-09-08 VITALS — BP 124/75 | HR 63 | Temp 98.1°F | Resp 17 | Ht 70.0 in | Wt 184.0 lb

## 2020-09-08 DIAGNOSIS — Z1211 Encounter for screening for malignant neoplasm of colon: Secondary | ICD-10-CM

## 2020-09-08 DIAGNOSIS — Z0000 Encounter for general adult medical examination without abnormal findings: Secondary | ICD-10-CM | POA: Diagnosis not present

## 2020-09-08 DIAGNOSIS — R0981 Nasal congestion: Secondary | ICD-10-CM

## 2020-09-08 DIAGNOSIS — R300 Dysuria: Secondary | ICD-10-CM | POA: Diagnosis not present

## 2020-09-08 DIAGNOSIS — R3989 Other symptoms and signs involving the genitourinary system: Secondary | ICD-10-CM

## 2020-09-08 DIAGNOSIS — C4490 Unspecified malignant neoplasm of skin, unspecified: Secondary | ICD-10-CM | POA: Diagnosis not present

## 2020-09-08 DIAGNOSIS — K429 Umbilical hernia without obstruction or gangrene: Secondary | ICD-10-CM

## 2020-09-08 DIAGNOSIS — Z125 Encounter for screening for malignant neoplasm of prostate: Secondary | ICD-10-CM

## 2020-09-08 LAB — POC URINALSYSI DIPSTICK (AUTOMATED)
Bilirubin, UA: NEGATIVE
Blood, UA: NEGATIVE
Glucose, UA: NEGATIVE
Ketones, UA: NEGATIVE
Leukocytes, UA: NEGATIVE
Nitrite, UA: NEGATIVE
Protein, UA: NEGATIVE
Spec Grav, UA: 1.02 (ref 1.010–1.025)
Urobilinogen, UA: 0.2 E.U./dL
pH, UA: 6 (ref 5.0–8.0)

## 2020-09-08 NOTE — Progress Notes (Signed)
Subjective:    Patient ID: Thomas Skinner, male    DOB: Apr 23, 1966, 54 y.o.   MRN: [id number]  HPI  Pt in for wellness exam.   Pt is not fasting.  Pt declines flu vaccine. He declines covid vaccine as well..  No colon cancer family history.  No prostate cancer family  history.  Pt lives general healthy life stye. He does exercise regularly.  More than a year since last physical. No acute problem so I decided to go ahead and do physical.  Pt has some moles that have popped out. He wants referral dermatologist.   Pt also thinks he has chronic nasal congestion/restricted flow. He states no allergy symptoms. He wants referral to ENT. He uses flonase nasal spray and vicks vapor rub.       Review of Systems  Constitutional: Negative for chills, fatigue and fever.  HENT: Positive for congestion. Negative for facial swelling, mouth sores, postnasal drip, rhinorrhea and sinus pain.   Respiratory: Negative for cough, choking, shortness of breath and wheezing.   Cardiovascular: Negative for chest pain and palpitations.  Gastrointestinal: Negative for abdominal pain.  Genitourinary:       Occasional pt has random pain below glands penis. Pt has sensation of something lodged in penis. This comes and goes. But when he palpates the area does not feel anything.   Once every couple of months.   Musculoskeletal: Negative for back pain and joint swelling.  Skin: Negative for rash.  Neurological: Negative for dizziness, seizures and weakness.  Hematological: Negative for adenopathy. Does not bruise/bleed easily.  Psychiatric/Behavioral: Negative for behavioral problems and confusion.    Past Medical History:  Diagnosis Date  . Heart murmur   . Right lateral epicondylitis 09/16/2015     Social History   Socioeconomic History  . Marital status: Single    Spouse name: Not on file  . Number of children: Not on file  . Years of education: Not on file  . Highest education level:  Not on file  Occupational History  . Not on file  Tobacco Use  . Smoking status: Never Smoker  . Smokeless tobacco: Never Used  Substance and Sexual Activity  . Alcohol use: Yes    Alcohol/week: 0.0 standard drinks    Comment: Rare occasinal one galss of wine.  . Drug use: No  . Sexual activity: Not on file  Other Topics Concern  . Not on file  Social History Narrative  . Not on file   Social Determinants of Health   Financial Resource Strain:   . Difficulty of Paying Living Expenses: Not on file  Food Insecurity:   . Worried About Charity fundraiser in the Last Year: Not on file  . Ran Out of Food in the Last Year: Not on file  Transportation Needs:   . Lack of Transportation (Medical): Not on file  . Lack of Transportation (Non-Medical): Not on file  Physical Activity:   . Days of Exercise per Week: Not on file  . Minutes of Exercise per Session: Not on file  Stress:   . Feeling of Stress : Not on file  Social Connections:   . Frequency of Communication with Friends and Family: Not on file  . Frequency of Social Gatherings with Friends and Family: Not on file  . Attends Religious Services: Not on file  . Active Member of Clubs or Organizations: Not on file  . Attends Archivist Meetings: Not on file  . Marital  Status: Not on file  Intimate Partner Violence:   . Fear of Current or Ex-Partner: Not on file  . Emotionally Abused: Not on file  . Physically Abused: Not on file  . Sexually Abused: Not on file    Past Surgical History:  Procedure Laterality Date  . APPENDECTOMY    . EYE SURGERY    . KNEE ARTHROPLASTY     Left    No family history on file.  No Known Allergies  Current Outpatient Medications on File Prior to Visit  Medication Sig Dispense Refill  . Multiple Vitamins-Minerals (PX MENS MULTIVITAMINS) TABS Take by mouth daily.    . nitroGLYCERIN (NITRODUR - DOSED IN MG/24 HR) 0.2 mg/hr patch Apply 1/4th patch to affected elbow, change  daily 30 patch 1  . fluticasone (FLONASE) 50 MCG/ACT nasal spray Place 2 sprays into both nostrils daily. (Patient not taking: Reported on 09/08/2020) 16 g 1  . Triamcinolone Acetonide (TRIAMCINOLONE 0.1 % CREAM : EUCERIN) CREA Apply 1 application topically 2 (two) times daily as needed. Eczema (Patient not taking: Reported on 09/08/2020)     No current facility-administered medications on file prior to visit.    BP 124/75   Pulse 63   Temp 98.1 F (36.7 C) (Oral)   Resp 17   Ht 5\' 10"  (1.778 m)   Wt 184 lb (83.5 kg)   SpO2 100%   BMI 26.40 kg/m       Objective:   Physical Exam  General Mental Status- Alert. General Appearance- Not in acute distress.   Skin scatterd small moles on skin. Numerous on left thorax,  Neck Carotid Arteries- Normal color. Moisture- Normal Moisture. No carotid bruits. No JVD.  Chest and Lung Exam Auscultation: Breath Sounds:-Normal.  Cardiovascular Auscultation:Rythm- Regular. Murmurs & Other Heart Sounds:Auscultation of the heart reveals- No Murmurs.  Abdomen Inspection:-Inspeection Normal. Palpation/Percussion:Note:No mass. Palpation and Percussion of the abdomen reveal- Non Tender, Non Distended + BS, no rebound or guarding.    Neurologic Cranial Nerve exam:- CN III-XII intact(No nystagmus), symmetric smile. Strength:- 5/5 equal and symmetric strength both upper and lower extremities.      Assessment & Plan:  For you wellness exam today I have ordered cbc, cmp, psa and  lipid panel. Future labs when fasting. Schedule on the way out.  Flu vaccine declined.  Recommend exercise and healthy diet.  We will let you know lab results as they come in.  Follow up date appointment will be determined after lab review.   Referred to derm for skin cancer screening. Referred to gi for screening colonoscopy. Refer to ENT for chronic nasal congestion. 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 Refer to general surgeon to  evaluate small umbilical hernia.  Mackie Pai, [place]    [id number] charge as did place 3 referral ent, general surgeon and urologist. Counseled on each referring condition/explaining possible causes and potential work up.

## 2020-09-08 NOTE — Patient Instructions (Addendum)
For you wellness exam today I have ordered cbc, cmp, psa and  lipid panel. Future labs when fasting. Schedule on the way out.  UA done to evaluate your urethritis.  Flu vaccine declined.  Recommend exercise and healthy diet.  We will let you know lab results as they come in.  Follow up date appointment will be determined after lab review.   Referred to derm for skin cancer screening. Referred to gi for screening colonoscopy. Refer to ENT for chronic nasal congestion. 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 Refer to general surgeon to evaluate small umbilical hernia.   Preventive Care 68-51 Years Old, Male Preventive care refers to lifestyle choices and visits with your health care provider that can promote health and wellness. This includes:  A yearly physical exam. This is also called an annual well check.  Regular dental and eye exams.  Immunizations.  Screening for certain conditions.  Healthy lifestyle choices, such as eating a healthy diet, getting regular exercise, not using drugs or products that contain nicotine and tobacco, and limiting alcohol use. What can I expect for my preventive care visit? Physical exam Your health care provider will check:  Height and weight. These may be used to calculate body mass index (BMI), which is a measurement that tells if you are at a healthy weight.  Heart rate and blood pressure.  Your skin for abnormal spots. Counseling Your health care provider may ask you questions about:  Alcohol, tobacco, and drug use.  Emotional well-being.  Home and relationship well-being.  Sexual activity.  Eating habits.  Work and work Statistician. What immunizations do I need?  Influenza (flu) vaccine  This is recommended every year. Tetanus, diphtheria, and pertussis (Tdap) vaccine  You may need a Td booster every 10 years. Varicella (chickenpox) vaccine  You may need this vaccine if you have not already been  vaccinated. Zoster (shingles) vaccine  You may need this after age 80. Measles, mumps, and rubella (MMR) vaccine  You may need at least one dose of MMR if you were born in 1957 or later. You may also need a second dose. Pneumococcal conjugate (PCV13) vaccine  You may need this if you have certain conditions and were not previously vaccinated. Pneumococcal polysaccharide (PPSV23) vaccine  You may need one or two doses if you smoke cigarettes or if you have certain conditions. Meningococcal conjugate (MenACWY) vaccine  You may need this if you have certain conditions. Hepatitis A vaccine  You may need this if you have certain conditions or if you travel or work in places where you may be exposed to hepatitis A. Hepatitis B vaccine  You may need this if you have certain conditions or if you travel or work in places where you may be exposed to hepatitis B. Haemophilus influenzae type b (Hib) vaccine  You may need this if you have certain risk factors. Human papillomavirus (HPV) vaccine  If recommended by your health care provider, you may need three doses over 6 months. You may receive vaccines as individual doses or as more than one vaccine together in one shot (combination vaccines). Talk with your health care provider about the risks and benefits of combination vaccines. What tests do I need? Blood tests  Lipid and cholesterol levels. These may be checked every 5 years, or more frequently if you are over 59 years old.  Hepatitis C test.  Hepatitis B test. Screening  Lung cancer screening. You may have this screening every year starting at age  55 if you have a 30-pack-year history of smoking and currently smoke or have quit within the past 15 years.  Prostate cancer screening. Recommendations will vary depending on your family history and other risks.  Colorectal cancer screening. All adults should have this screening starting at age 50 and continuing until age 75. Your  health care provider may recommend screening at age 45 if you are at increased risk. You will have tests every 1-10 years, depending on your results and the type of screening test.  Diabetes screening. This is done by checking your blood sugar (glucose) after you have not eaten for a while (fasting). You may have this done every 1-3 years.  Sexually transmitted disease (STD) testing. Follow these instructions at home: Eating and drinking  Eat a diet that includes fresh fruits and vegetables, whole grains, lean protein, and low-fat dairy products.  Take vitamin and mineral supplements as recommended by your health care provider.  Do not drink alcohol if your health care provider tells you not to drink.  If you drink alcohol: ? Limit how much you have to 0-2 drinks a day. ? Be aware of how much alcohol is in your drink. In the [place] one drink equals one 12 oz bottle of beer (355 mL), one 5 oz glass of wine (148 mL), or one 1 oz glass of hard liquor (44 mL). Lifestyle  Take daily care of your teeth and gums.  Stay active. Exercise for at least 30 minutes on 5 or more days each week.  Do not use any products that contain nicotine or tobacco, such as cigarettes, e-cigarettes, and chewing tobacco. If you need help quitting, ask your health care provider.  If you are sexually active, practice safe sex. Use a condom or other form of protection to prevent STIs (sexually transmitted infections).  Talk with your health care provider about taking a low-dose aspirin every day starting at age 50. What's next?  Go to your health care provider once a year for a well check visit.  Ask your health care provider how often you should have your eyes and teeth checked.  Stay up to date on all vaccines. This information is not intended to replace advice given to you by your health care provider. Make sure you discuss any questions you have with your health care provider. Document Revised: 09/27/2018  Document Reviewed: 09/27/2018 Elsevier Patient Education  2020 Elsevier Inc.  

## 2020-09-28 ENCOUNTER — Other Ambulatory Visit (INDEPENDENT_AMBULATORY_CARE_PROVIDER_SITE_OTHER): Payer: BC Managed Care – PPO

## 2020-09-28 ENCOUNTER — Encounter: Payer: Self-pay | Admitting: Medical

## 2020-09-28 ENCOUNTER — Other Ambulatory Visit: Payer: Self-pay

## 2020-09-28 DIAGNOSIS — Z0000 Encounter for general adult medical examination without abnormal findings: Secondary | ICD-10-CM | POA: Diagnosis not present

## 2020-09-28 DIAGNOSIS — Z125 Encounter for screening for malignant neoplasm of prostate: Secondary | ICD-10-CM

## 2020-09-28 LAB — CBC WITH DIFFERENTIAL/PLATELET
Basophils Absolute: 0.1 10*3/uL (ref 0.0–0.1)
Basophils Relative: 2 % (ref 0.0–3.0)
Eosinophils Absolute: 0.2 10*3/uL (ref 0.0–0.7)
Eosinophils Relative: 4.4 % (ref 0.0–5.0)
HCT: 41.6 % (ref 39.0–52.0)
Hemoglobin: 14.6 g/dL (ref 13.0–17.0)
Lymphocytes Relative: 34.1 % (ref 12.0–46.0)
Lymphs Abs: 1.6 10*3/uL (ref 0.7–4.0)
MCHC: 35 g/dL (ref 30.0–36.0)
MCV: 98.1 fl (ref 78.0–100.0)
Monocytes Absolute: 0.5 10*3/uL (ref 0.1–1.0)
Monocytes Relative: 9.8 % (ref 3.0–12.0)
Neutro Abs: 2.4 10*3/uL (ref 1.4–7.7)
Neutrophils Relative %: 49.7 % (ref 43.0–77.0)
Platelets: 203 10*3/uL (ref 150.0–400.0)
RBC: 4.24 Mil/uL (ref 4.22–5.81)
RDW: 12.2 % (ref 11.5–15.5)
WBC: 4.8 10*3/uL (ref 4.0–10.5)

## 2020-09-28 LAB — LIPID PANEL
Cholesterol: 191 mg/dL (ref 0–200)
HDL: 49.4 mg/dL (ref >39.00)
LDL Cholesterol: 131 mg/dL — ABNORMAL HIGH (ref 0–99)
NonHDL: 141.11
Total CHOL/HDL Ratio: 4
Triglycerides: 52 mg/dL (ref 0.0–149.0)
VLDL: 10.4 mg/dL (ref 0.0–40.0)

## 2020-09-28 LAB — COMPREHENSIVE METABOLIC PANEL
ALT: 21 U/L (ref 0–53)
AST: 22 U/L (ref 0–37)
Albumin: 4.4 g/dL (ref 3.5–5.2)
Alkaline Phosphatase: 31 U/L — ABNORMAL LOW (ref 39–117)
BUN: 22 mg/dL (ref 6–23)
CO2: 29 mEq/L (ref 19–32)
Calcium: 9.5 mg/dL (ref 8.4–10.5)
Chloride: 104 mEq/L (ref 96–112)
Creatinine, Ser: 1.24 mg/dL (ref 0.40–1.50)
GFR: 67.06 mL/min (ref >60.00)
Glucose, Bld: 86 mg/dL (ref 70–99)
Potassium: 4.8 mEq/L (ref 3.5–5.1)
Sodium: 141 mEq/L (ref 135–145)
Total Bilirubin: 0.8 mg/dL (ref 0.2–1.2)
Total Protein: 7 g/dL (ref 6.0–8.3)

## 2020-09-28 LAB — PSA: PSA: 0.46 ng/mL (ref 0.10–4.00)

## 2020-09-28 NOTE — Addendum Note (Signed)
Addended by: Kelle Darting A on: 09/28/2020 07:45 AM   Modules accepted: Orders

## 2020-10-02 ENCOUNTER — Telehealth: Payer: Self-pay | Admitting: Medical

## 2020-10-02 DIAGNOSIS — Z1152 Encounter for screening for COVID-19: Secondary | ICD-10-CM

## 2020-10-02 NOTE — Telephone Encounter (Signed)
Opened to review 

## 2020-10-02 NOTE — Telephone Encounter (Signed)
Discussed with pt today that I did not see his my chart message he sent me on 09-28-2020. He states he remebers seeing message that someone reviewed message on 71 th,.  Discussed his blood work. He wants igg covid test. Placed order to get done future. He will call and schedule.

## 2020-10-02 NOTE — Telephone Encounter (Signed)
Pt states he sent me my chart message on 09-28-2020. I did not see this until today. I check and clear my chart daily. How did this happen. Need help as pt expect rapid response and we also have 3 day metric turn around time.

## 2020-10-14 ENCOUNTER — Telehealth: Payer: Self-pay | Admitting: Medical

## 2020-10-14 NOTE — Telephone Encounter (Signed)
This is another my chart message on 10-02-20. I never saw this in my box. Only randomly found later. Can you track.

## 2020-10-26 ENCOUNTER — Encounter (INDEPENDENT_AMBULATORY_CARE_PROVIDER_SITE_OTHER): Payer: Self-pay | Admitting: Otolaryngology

## 2020-10-26 ENCOUNTER — Ambulatory Visit (INDEPENDENT_AMBULATORY_CARE_PROVIDER_SITE_OTHER): Payer: BC Managed Care – PPO | Admitting: Otolaryngology

## 2020-10-26 ENCOUNTER — Other Ambulatory Visit: Payer: Self-pay

## 2020-10-26 VITALS — Temp 97.7°F

## 2020-10-26 DIAGNOSIS — J342 Deviated nasal septum: Secondary | ICD-10-CM

## 2020-10-26 DIAGNOSIS — J310 Chronic rhinitis: Secondary | ICD-10-CM

## 2020-10-26 NOTE — Progress Notes (Signed)
HPI: Thomas Skinner is a 55 y.o. male who presents is referred by by his PCP for evaluation of nasal obstruction.  He describes difficulty breathing through his nose where he feels like his nose is restricted and also complains of mucus discharge.  Sometimes it is crusty.  He has used Flonase in the past but is currently not using any medication for his nose.Marland Kitchen  Past Medical History:  Diagnosis Date  . Heart murmur   . Right lateral epicondylitis 09/16/2015   Past Surgical History:  Procedure Laterality Date  . APPENDECTOMY    . EYE SURGERY    . KNEE ARTHROPLASTY     Left   Social History   Socioeconomic History  . Marital status: Single    Spouse name: Not on file  . Number of children: Not on file  . Years of education: Not on file  . Highest education level: Not on file  Occupational History  . Not on file  Tobacco Use  . Smoking status: Never Smoker  . Smokeless tobacco: Never Used  Substance and Sexual Activity  . Alcohol use: Yes    Alcohol/week: 0.0 standard drinks    Comment: Rare occasinal one galss of wine.  . Drug use: No  . Sexual activity: Not on file  Other Topics Concern  . Not on file  Social History Narrative  . Not on file   Social Determinants of Health   Financial Resource Strain: Not on file  Food Insecurity: Not on file  Transportation Needs: Not on file  Physical Activity: Not on file  Stress: Not on file  Social Connections: Not on file   No family history on file. No Known Allergies Prior to Admission medications   Medication Sig Start Date End Date Taking? Authorizing Provider  fluticasone (FLONASE) 50 MCG/ACT nasal spray Place 2 sprays into both nostrils daily. Patient not taking: Reported on 09/08/2020 05/15/15   Saguier, Percell Miller, PA-C  Multiple Vitamins-Minerals (PX MENS MULTIVITAMINS) TABS Take by mouth daily.    [provider]  nitroGLYCERIN (NITRODUR - DOSED IN MG/24 HR) 0.2 mg/hr patch Apply 1/4th patch to affected elbow,  change daily 10/02/17   Hudnall, Sharyn Lull, MD  Triamcinolone Acetonide (TRIAMCINOLONE 0.1 % CREAM : EUCERIN) CREA Apply 1 application topically 2 (two) times daily as needed. Eczema Patient not taking: Reported on 09/08/2020    [provider]     Positive ROS: Otherwise negative  All other systems have been reviewed and were otherwise negative with the exception of those mentioned in the HPI and as above.  Physical Exam: Constitutional: Alert, well-appearing, no acute distress Ears: External ears without lesions or tenderness. Ear canals are clear bilaterally with intact, clear TMs.  Nasal: External nose without lesions. Septum slightly deviated to the right.  He has moderate rhinitis with swollen mucous membranes..  After decongesting the nose nasal passages otherwise clear.  There are no polyps no obstructing lesions within the nose.  Both middle meatus regions were clear with no signs of infection. Oral: Lips and gums without lesions. Tongue and palate mucosa without lesions. Posterior oropharynx clear. Neck: No palpable adenopathy or masses Respiratory: Breathing comfortably  Skin: No facial/neck lesions or rash noted.  Procedures  Assessment: Chronic rhinitis with mild septal deformity.  Plan: Discussed with patient today concerning regular use of nasal steroid spray which should offer adequate benefit to his breathing and prescribed Nasacort 2 sprays each nostril at night.  If he continues to have trouble with nasal obstruction  could consider surgical intervention but would recommend regular use of nasal steroid spray initially.   Radene Journey, MD   CC:

## 2020-11-10 ENCOUNTER — Other Ambulatory Visit: Payer: Self-pay | Admitting: Surgery

## 2020-11-10 DIAGNOSIS — K429 Umbilical hernia without obstruction or gangrene: Secondary | ICD-10-CM | POA: Diagnosis not present

## 2020-11-17 DIAGNOSIS — Z20822 Contact with and (suspected) exposure to covid-19: Secondary | ICD-10-CM | POA: Diagnosis not present

## 2020-12-25 ENCOUNTER — Ambulatory Visit (AMBULATORY_SURGERY_CENTER): Payer: Self-pay

## 2020-12-25 ENCOUNTER — Other Ambulatory Visit: Payer: Self-pay

## 2020-12-25 VITALS — Ht 70.0 in | Wt 187.0 lb

## 2020-12-25 DIAGNOSIS — Z1211 Encounter for screening for malignant neoplasm of colon: Secondary | ICD-10-CM

## 2020-12-25 MED ORDER — NA SULFATE-K SULFATE-MG SULF 17.5-3.13-1.6 GM/177ML PO SOLN: 1.0000 | Freq: Once | ORAL | 0 refills | Status: AC

## 2020-12-25 NOTE — Progress Notes (Signed)
No egg or soy allergy known to patient  No issues with past sedation with any surgeries or procedures No intubation problems in the past  No FH of Malignant Hyperthermia No diet pills per patient No home 02 use per patient  No blood thinners per patient  Pt denies issues with constipation  No A fib or A flutter  EMMI video via [place] [facility] guidelines implemented in PV today with Pt and RN  Coupon given to pt in PV today , Code to Pharmacy and  NO PA's for preps discussed with pt In PV today  Discussed with pt there will be an out-of-pocket cost for prep and that varies from $0 to 70 dollars  Due to the COVID-19 pandemic we are asking patients to follow certain guidelines.  Pt aware of COVID protocols and [place] guidelines  Patient denies loose or missing teeth, dentures, dental implants; Patient reports bridges and crowns;

## 2021-01-01 ENCOUNTER — Encounter: Payer: BC Managed Care – PPO | Admitting: Gastroenterology

## 2021-02-19 ENCOUNTER — Telehealth: Payer: Self-pay | Admitting: Gastroenterology

## 2021-02-19 DIAGNOSIS — Z1211 Encounter for screening for malignant neoplasm of colon: Secondary | ICD-10-CM

## 2021-02-19 MED ORDER — SUPREP BOWEL PREP KIT 17.5-3.13-1.6 GM/177ML PO SOLN: 1.0000 | Freq: Once | ORAL | 0 refills | Status: AC

## 2021-02-19 NOTE — Telephone Encounter (Signed)
Pt Rs colon- My Chart 5-27 SUprep instructions Also mailed to pt- verified address with pt-   Thomas Skinner PV

## 2021-02-19 NOTE — Telephone Encounter (Signed)
Please call pt. He has questions regarding his procedure on 03/12/21. Thank you

## 2021-02-26 ENCOUNTER — Encounter: Payer: BC Managed Care – PPO | Admitting: Gastroenterology

## 2021-03-08 ENCOUNTER — Encounter: Payer: Self-pay | Admitting: Gastroenterology

## 2021-03-12 ENCOUNTER — Encounter: Payer: Self-pay | Admitting: Gastroenterology

## 2021-03-12 ENCOUNTER — Ambulatory Visit (AMBULATORY_SURGERY_CENTER): Payer: BC Managed Care – PPO | Admitting: Gastroenterology

## 2021-03-12 ENCOUNTER — Other Ambulatory Visit: Payer: Self-pay

## 2021-03-12 VITALS — BP 110/67 | HR 60 | Temp 98.2°F | Resp 12 | Ht 70.0 in | Wt 187.0 lb

## 2021-03-12 DIAGNOSIS — Z1211 Encounter for screening for malignant neoplasm of colon: Secondary | ICD-10-CM | POA: Diagnosis not present

## 2021-03-12 DIAGNOSIS — K640 First degree hemorrhoids: Secondary | ICD-10-CM

## 2021-03-12 MED ORDER — SODIUM CHLORIDE 0.9 % IV SOLN: 500.0000 mL | Freq: Once | INTRAVENOUS | Status: DC

## 2021-03-12 NOTE — Progress Notes (Signed)
Cw vitals and SS IV.

## 2021-03-12 NOTE — Progress Notes (Signed)
A and O x3. Report to RN. Tolerated MAC anesthesia well.

## 2021-03-12 NOTE — Op Note (Signed)
[name] Patient Name: Thomas Skinner Procedure Date: 03/12/2021 8:52 AM MRN: [id number] Endoscopist: Gerrit Heck , MD Age: 55 Referring MD:  Date of Birth: [birth year] Gender: Male Account #: [id number] Procedure:                Colonoscopy Indications:              Screening for colorectal malignant neoplasm, This                            is the patient's first colonoscopy Medicines:                Monitored Anesthesia Care Procedure:                Pre-Anesthesia Assessment:                           - Prior to the procedure, a History and Physical                            was performed, and patient medications and                            allergies were reviewed. The patient's tolerance of                            previous anesthesia was also reviewed. The risks                            and benefits of the procedure and the sedation                            options and risks were discussed with the patient.                            All questions were answered, and informed consent                            was obtained. Prior Anticoagulants: The patient has                            taken no previous anticoagulant or antiplatelet                            agents. ASA Grade Assessment: II - A patient with                            mild systemic disease. After reviewing the risks                            and benefits, the patient was deemed in                            satisfactory condition to undergo the procedure.  After obtaining informed consent, the colonoscope                            was passed under direct vision. Throughout the                            procedure, the patient's blood pressure, pulse, and                            oxygen saturations were monitored continuously. The                            Olympus CF-HQ190L ([id number]) Colonoscope was                            introduced through the anus  and advanced to the the                            terminal ileum. The colonoscopy was performed                            without difficulty. The patient tolerated the                            procedure well. The quality of the bowel                            preparation was good. The terminal ileum, ileocecal                            valve, appendiceal orifice, and rectum were                            photographed. Scope In: 9:07:15 AM Scope Out: 9:20:36 AM Scope Withdrawal Time: 0 hours 10 minutes 0 seconds  Total Procedure Duration: 0 hours 13 minutes 21 seconds  Findings:                 The perianal and digital rectal examinations were                            normal.                           The colon (entire examined portion) appeared normal.                           Non-bleeding internal hemorrhoids were found during                            retroflexion. The hemorrhoids were small.                           The terminal ileum appeared normal. Complications:            No immediate complications. Estimated Blood Loss:  Estimated blood loss: none. Impression:               - The entire examined colon is normal.                           - Non-bleeding internal hemorrhoids.                           - The examined portion of the ileum was normal.                           - No specimens collected. Recommendation:           - Patient has a contact number available for                            emergencies. The signs and symptoms of potential                            delayed complications were discussed with the                            patient. Return to normal activities tomorrow.                            Written discharge instructions were provided to the                            patient.                           - Resume previous diet.                           - Continue present medications.                           - Repeat colonoscopy in 10 years  for screening                            purposes.                           - Return to GI office PRN. Gerrit Heck, MD 03/12/2021 9:24:18 AM

## 2021-03-12 NOTE — Patient Instructions (Signed)
YOU HAD AN ENDOSCOPIC PROCEDURE TODAY AT THE [facility]:   Refer to the procedure report that was given to you for any specific questions about what was found during the examination.  If the procedure report does not answer your questions, please call your gastroenterologist to clarify.  If you requested that your care partner not be given the details of your procedure findings, then the procedure report has been included in a sealed envelope for you to review at your convenience later.  YOU SHOULD EXPECT: Some feelings of bloating in the abdomen. Passage of more gas than usual.  Walking can help get rid of the air that was put into your GI tract during the procedure and reduce the bloating. If you had a lower endoscopy (such as a colonoscopy or flexible sigmoidoscopy) you may notice spotting of blood in your stool or on the toilet paper. If you underwent a bowel prep for your procedure, you may not have a normal bowel movement for a few days.  Please Note:  You might notice some irritation and congestion in your nose or some drainage.  This is from the oxygen used during your procedure.  There is no need for concern and it should clear up in a day or so.  SYMPTOMS TO REPORT IMMEDIATELY:   Following lower endoscopy (colonoscopy or flexible sigmoidoscopy):  Excessive amounts of blood in the stool  Significant tenderness or worsening of abdominal pains  Swelling of the abdomen that is new, acute  Fever of 100F or higher   Following upper endoscopy (EGD)  Vomiting of blood or coffee ground material  New chest pain or pain under the shoulder blades  Painful or persistently difficult swallowing  New shortness of breath  Fever of 100F or higher  Black, tarry-looking stools  For urgent or emergent issues, a gastroenterologist can be reached at any hour by calling [phone]. Do not use MyChart messaging for urgent concerns.    DIET:  We do recommend a small meal at first, but  then you may proceed to your regular diet.  Drink plenty of fluids but you should avoid alcoholic beverages for 24 hours.  ACTIVITY:  You should plan to take it easy for the rest of today and you should NOT DRIVE or use heavy machinery until tomorrow (because of the sedation medicines used during the test).    FOLLOW UP: Our staff will call the number listed on your records 48-72 hours following your procedure to check on you and address any questions or concerns that you may have regarding the information given to you following your procedure. If we do not reach you, we will leave a message.  We will attempt to reach you two times.  During this call, we will ask if you have developed any symptoms of COVID 19. If you develop any symptoms (ie: fever, flu-like symptoms, shortness of breath, cough etc.) before then, please call [phone].  If you test positive for Covid 19 in the 2 weeks post procedure, please call and report this information to us.    If any biopsies were taken you will be contacted by phone or by letter within the next 1-3 weeks.  Please call us at [phone] if you have not heard about the biopsies in 3 weeks.    SIGNATURES/CONFIDENTIALITY: You and/or your care partner have signed paperwork which will be entered into your electronic medical record.  These signatures attest to the fact that that the information above on   your After Visit Summary has been reviewed and is understood.  Full responsibility of the confidentiality of this discharge information lies with you and/or your care-partner. 

## 2021-03-16 ENCOUNTER — Telehealth: Payer: Self-pay | Admitting: *Deleted

## 2021-03-16 ENCOUNTER — Telehealth: Payer: Self-pay

## 2021-03-16 NOTE — Telephone Encounter (Signed)
Attempted to reach patient for post-procedure f/u call. No answer. Left message that staff will make another attempt to call him later today and for him to please not hesitate to call us if he has any questions/concerns regarding his care.

## 2021-03-16 NOTE — Telephone Encounter (Signed)
  Follow up Call-  Call back number 03/12/2021  Post procedure Call Back phone  # [phone]  Permission to leave phone message Yes  Some recent data might be hidden     Patient questions:  Do you have a fever, pain , or abdominal swelling? No. Pain Score  0 *  Have you tolerated food without any problems? Yes.    Have you been able to return to your normal activities? Yes.    Do you have any questions about your discharge instructions: Diet   No. Medications  No. Follow up visit  No.  Do you have questions or concerns about your Care? No.  Actions: * If pain score is 4 or above: No action needed, pain <4.  1. Have you developed a fever since your procedure? no  2.   Have you had an respiratory symptoms (SOB or cough) since your procedure? no  3.   Have you tested positive for COVID 19 since your procedure no  4.   Have you had any family members/close contacts diagnosed with the COVID 19 since your procedure?  no   If yes to any of these questions please route to Joylene John, RN and Joella Prince, RN

## 2021-07-14 ENCOUNTER — Other Ambulatory Visit: Payer: Self-pay | Admitting: *Deleted

## 2021-07-14 DIAGNOSIS — Z125 Encounter for screening for malignant neoplasm of prostate: Secondary | ICD-10-CM

## 2021-07-14 NOTE — Progress Notes (Signed)
Patient: Thomas Skinner           Date of Birth: [birth year]           MRN: [id number] Visit Date: 07/14/2021 PCP: Mackie Pai, PA-C  Prostate Cancer Screening Date of last physical exam:  (Unknown) Date of last rectal exam:  (N/A) Have you ever had any of the following?: None Have you ever had or been told you have an allergy to latex products?: No Are you currently taking any natural prostate preparations?: No Are you currently experiencing any urinary symptoms?: No  Prostate Exam Exam not completed. PSA Only.  Patient's History Patient Active Problem List   Diagnosis Date Noted   Mallet finger of left hand 01/28/2020   Left elbow pain 10/02/2017   Left knee pain 10/02/2017   Wellness examination 05/15/2015   Allergic reaction 04/03/2015   Current tear knee, lateral meniscus 09/25/2014   Past Medical History:  Diagnosis Date   Heart murmur    childhood   Right lateral epicondylitis 09/16/2015    Family History  Problem Relation Age of Onset   Colon polyps Neg Hx    Colon cancer Neg Hx    Esophageal cancer Neg Hx    Rectal cancer Neg Hx    Stomach cancer Neg Hx     Social History   Occupational History   Not on file  Tobacco Use   Smoking status: Never   Smokeless tobacco: Never  Vaping Use   Vaping Use: Never used  Substance and Sexual Activity   Alcohol use: Yes    Alcohol/week: 0.0 standard drinks    Comment: Rare occasinal one galss of wine.   Drug use: No   Sexual activity: Not on file

## 2021-07-15 LAB — PSA: Prostate Specific Ag, Serum: 0.5 ng/mL (ref 0.0–4.0)

## 2021-07-20 ENCOUNTER — Other Ambulatory Visit: Payer: Self-pay | Admitting: Surgery

## 2021-07-20 DIAGNOSIS — K429 Umbilical hernia without obstruction or gangrene: Secondary | ICD-10-CM | POA: Diagnosis not present

## 2021-09-20 DIAGNOSIS — K429 Umbilical hernia without obstruction or gangrene: Secondary | ICD-10-CM | POA: Diagnosis not present

## 2022-03-07 ENCOUNTER — Ambulatory Visit: Payer: BC Managed Care – PPO | Admitting: Family Medicine

## 2022-03-07 ENCOUNTER — Encounter: Payer: Self-pay | Admitting: Family Medicine

## 2022-03-07 VITALS — BP 112/74 | HR 52 | Temp 98.1°F | Ht 70.0 in | Wt 183.4 lb

## 2022-03-07 DIAGNOSIS — S99911A Unspecified injury of right ankle, initial encounter: Secondary | ICD-10-CM

## 2022-03-07 NOTE — Patient Instructions (Addendum)
Ice/cold pack over area for 10-15 min twice daily.  OK to take Tylenol 1000 mg (2 extra strength tabs) or 975 mg (3 regular strength tabs) every 6 hours as needed.  Elevate the leg and wrap it.  Activity as tolerated.   Let us know if you need anything.  Ankle Exercises It is normal to feel mild stretching, pulling, tightness, or discomfort as you do these exercises, but you should stop right away if you feel sudden pain or your pain gets worse.  Stretching and range of motion exercises These exercises warm up your muscles and joints and improve the movement and flexibility of your ankle. These exercises also help to relieve pain, numbness, and tingling. Exercise A: Dorsiflexion/Plantar Flexion    Sit with your affected knee straight or bent. Do not rest your foot on anything. Flex your affected ankle to tilt the top of your foot toward your shin. Hold this position for 5 seconds. Point your toes downward to tilt the top of your foot away from your shin. Hold this position for 5 seconds. Repeat 2 times. Complete this exercise 3 times per week. Exercise B: Ankle Alphabet    Sit with your affected foot supported at your lower leg. Do not rest your foot on anything. Make sure your foot has room to move freely. Think of your affected foot as a paintbrush, and move your foot to trace each letter of the alphabet in the air. Keep your hip and knee still while you trace. Make the letters as large as you can without increasing any discomfort. Trace every letter from A to Z. Repeat 2 times. Complete this exercise 3 times per week. Strengthening exercises These exercises build strength and endurance in your ankle. Endurance is the ability to use your muscles for a long time, even after they get tired. Exercise D: Dorsiflexors    Secure a rubber exercise band or tube to an object, such as a table leg, that will stay still when the band is pulled. Secure the other end around your affected  foot. Sit on the floor, facing the object with your affected leg extended. The band or tube should be slightly tense when your foot is relaxed. Slowly flex your affected ankle and toes to bring your foot toward you. Hold this position for 3 seconds.  Slowly return your foot to the starting position, controlling the band as you do that. Do a total of 10 repetitions. Repeat 2 times. Complete this exercise 3 times per week. Exercise E: Plantar Flexors    Sit on the floor with your affected leg extended. Loop a rubber exercise band or tube around the ball of your affected foot. The ball of your foot is on the walking surface, right under your toes. The band or tube should be slightly tense when your foot is relaxed. Slowly point your toes downward, pushing them away from you. Hold this position for 3 seconds. Slowly release the tension in the band or tube, controlling smoothly until your foot is back in the starting position. Repeat for a total of 10 repetitions. Repeat 2 times. Complete this exercise 3 times per week. Exercise F: Towel Curls    Sit in a chair on a non-carpeted surface, and put your feet on the floor. Place a towel in front of your feet.  Keeping your heel on the floor, put your affected foot on the towel. Pull the towel toward you by grabbing the towel with your toes and curling them under.  Keep your heel on the floor. Let your toes relax. Grab the towel again. Keep going until the towel is completely underneath your foot. Repeat for a total of 10 repetitions. Repeat 2 times. Complete this exercise 3 times per week. Exercise G: Heel Raise ( Plantar Flexors, Standing)     Stand with your feet shoulder-width apart. Keep your weight spread evenly over the width of your feet while you rise up on your toes. Use a wall or table to steady yourself, but try not to use it for support. If this exercise is too easy, try these options: Shift your weight toward your affected leg  until you feel challenged. If told by your health care provider, lift your uninjured leg off the floor. Hold this position for 3 seconds. Repeat for a total of 10 repetitions. Repeat 2 times. Complete this exercise 3 times per week. Exercise H: Tandem Walking Stand with one foot directly in front of the other. Slowly raise your back foot up, lifting your heel before your toes, and place it directly in front of your other foot. Continue to walk in this heel-to-toe way for 10 steps or for as long as told by your health care provider. Have a countertop or wall nearby to use if needed to keep your balance, but try not to hold onto anything for support. Repeat 2 times. Complete this exercises 3 times per week. Make sure you discuss any questions you have with your health care provider. Document Released: 08/17/2005 Document Revised: 06/02/2016 Document Reviewed: 06/21/2015 Elsevier Interactive Patient Education  2018 [employer].

## 2022-03-07 NOTE — Progress Notes (Signed)
Musculoskeletal Exam  Patient: Thomas Skinner DOB: 1966-06-18  DOS: 03/07/2022  SUBJECTIVE:  Chief Complaint:   Chief Complaint  Patient presents with   Ankle Pain    Right     [name] is a 56 y.o.  male for evaluation and treatment of R ankle pain.   Onset:  3 days ago. Inverted ankle. Location: Right ankle, behind lateral malleolus Character:  sharp pain at first, resolved Progression of issue:  has slightly improved Associated symptoms: heard a crack, swelling, bruising Treatment: to date has been rest and ice.   Neurovascular symptoms: no  Past Medical History:  Diagnosis Date   Heart murmur    childhood   Right lateral epicondylitis 09/16/2015    Objective: VITAL SIGNS: BP 112/74   Pulse (!) 52   Temp 98.1 F (36.7 C) (Oral)   Ht '5\' 10"'$  (1.778 m)   Wt 183 lb 6 oz (83.2 kg)   SpO2 99%   BMI 26.31 kg/m  Constitutional: Well formed, well developed. No acute distress. Thorax & Lungs: No accessory muscle use Musculoskeletal: R ankle.   Normal active range of motion: yes.   Normal passive range of motion: yes Tenderness to palpation: mild ttp over peroneus brevis/longer posterior to the lateral mall No bony ttp.  Deformity: edema noted on lateral mall Ecchymosis: yes Tests positive: none Tests negative: Anterior drawer Neurologic: Normal sensory function.  Psychiatric: Normal mood. Age appropriate judgment and insight. Alert & oriented x 3.    Assessment:  Injury of right ankle, initial encounter  Plan: I think he strained his peroneus brevis/longus posterior to the lateral malleolus.  Surprisingly, pain is minimal.  Stretches/exercises, heat, ice, compression, Tylenol.  Does not meet any of the [place] ankle rule criteria, will hold off on imaging. He is OK with this. F/u as needed. The patient voiced understanding and agreement to the plan.   [place], DO 03/07/22  4:44 PM

## 2022-04-13 ENCOUNTER — Ambulatory Visit (HOSPITAL_COMMUNITY)
Admission: RE | Admit: 2022-04-13 | Discharge: 2022-04-13 | Disposition: A | Discharge status: Home / Self Care | Payer: BC Managed Care – PPO | Source: Ambulatory Visit | Attending: Medical | Admitting: Medical

## 2022-04-13 ENCOUNTER — Ambulatory Visit (HOSPITAL_BASED_OUTPATIENT_CLINIC_OR_DEPARTMENT_OTHER)
Admission: RE | Admit: 2022-04-13 | Discharge: 2022-04-13 | Disposition: A | Discharge status: Home / Self Care | Payer: BC Managed Care – PPO | Source: Ambulatory Visit | Attending: Medical | Admitting: Medical

## 2022-04-13 ENCOUNTER — Ambulatory Visit: Payer: BC Managed Care – PPO | Admitting: Medical

## 2022-04-13 ENCOUNTER — Encounter (HOSPITAL_COMMUNITY): Payer: Self-pay

## 2022-04-13 VITALS — BP 127/66 | HR 66 | Resp 18 | Ht 70.0 in | Wt 185.0 lb

## 2022-04-13 DIAGNOSIS — M25571 Pain in right ankle and joints of right foot: Secondary | ICD-10-CM

## 2022-04-13 DIAGNOSIS — S8261XA Displaced fracture of lateral malleolus of right fibula, initial encounter for closed fracture: Secondary | ICD-10-CM

## 2022-04-13 NOTE — Addendum Note (Signed)
Addended by: Anabel Halon on: 04/13/2022 06:02 PM   Modules accepted: Orders

## 2022-04-13 NOTE — Progress Notes (Signed)
   Subjective:    Patient ID: Thomas Skinner, male    DOB: [birth year], 56 y.o.   MRN: [id number]  HPI  Pt in for follow up.  Pt states pain after inversion inversion injury.  Dr. Nani Ravens note from last visit.  hpi "Onset:  3 days ago. Inverted ankle. Location: Right ankle, behind lateral malleolus Character:  sharp pain at first, resolved Progression of issue:  has slightly improved Associated symptoms: heard a crack, swelling, bruising Treatment: to date has been rest and ice.   Neurovascular symptoms: no"  Plan- " I think he strained his peroneus brevis/longus posterior to the lateral malleolus.  Surprisingly, pain is minimal.  Stretches/exercises, heat, ice, compression, Tylenol.  Does not meet any of the [place] ankle rule criteria, will hold off on imaging. He is OK with this. F/u as needed."  Pt states less swollen and still in some pain. He does want xray.      Review of Systems  Musculoskeletal:        Rt ankle pain and swelling.       Objective:   Physical Exam  General- No acute distress. Pleasant patient.  Rt ankle- swelling and tenderness over talofibular ligament. Rt foot- no tednerness on palpation.     Assessment & Plan:   Patient Instructions  Rt ankle pain and swelling persisting since injury.  Recommend rest, ice, compression and elevation.  Can use low dose ibprofen for pain or inflammation 200-400 mg every 8 hours if needed.  If xray negative and pain persists could refer to sports med MD. Possible [place] of talofibular ligament to assess soft tissue.  Follow up late summer or early fall for wellness exam. Sooner if needed.    Mackie Pai, PA-C

## 2022-04-13 NOTE — Patient Instructions (Addendum)
Rt ankle pain and swelling persisting since injury.  Recommend rest, ice, compression and elevation.  Can use low dose ibprofen for pain or inflammation 200-400 mg every 8 hours if needed.  If xray negative and pain persists could refer to sports med MD. Possible [place] of talofibular ligament to assess soft tissue.  Follow up late summer or early fall for wellness exam. Sooner if needed.

## 2022-05-18 ENCOUNTER — Ambulatory Visit: Payer: BC Managed Care – PPO | Admitting: Medical

## 2022-05-18 ENCOUNTER — Ambulatory Visit (HOSPITAL_BASED_OUTPATIENT_CLINIC_OR_DEPARTMENT_OTHER)
Admission: RE | Admit: 2022-05-18 | Discharge: 2022-05-18 | Disposition: A | Discharge status: Home / Self Care | Payer: BC Managed Care – PPO | Source: Ambulatory Visit | Attending: Medical | Admitting: Medical

## 2022-05-18 ENCOUNTER — Other Ambulatory Visit: Payer: Self-pay | Admitting: Medical

## 2022-05-18 VITALS — BP 112/60 | HR 65 | Ht 70.0 in | Wt 182.2 lb

## 2022-05-18 DIAGNOSIS — M79645 Pain in left finger(s): Secondary | ICD-10-CM | POA: Insufficient documentation

## 2022-05-18 DIAGNOSIS — M79642 Pain in left hand: Secondary | ICD-10-CM

## 2022-05-18 NOTE — Patient Instructions (Addendum)
Left 3rd digit finger pain for one month. Will get xray of 3rd digit today.    Also decided to get hand xray since some pain on palpation of palm.  If no fracture and pain persists then can consider referral to sport med before your trip as [place] could reveal ganglion   Can use ibuprofen if needed for pain.  Follow up date to be determined after xray review.

## 2022-05-18 NOTE — Progress Notes (Signed)
Subjective:    Patient ID: Thomas Skinner, male    DOB: 10-01-1966, 56 y.o.   MRN: [id number]  HPI Pt in with left hand pain.  Pt states hand pain around time he was working on his car and he state has grip strength exerciser. He states pain has been present for one month.  Pt has been working out and has pain.  Pt thinks his hand is mild swollen.  Pt is about to go [place] for about month. Leaving on the 9th.     Review of Systems  Constitutional:  Negative for chills, fatigue and fever.  Respiratory:  Negative for cough, chest tightness, shortness of breath and wheezing.   Cardiovascular:  Negative for chest pain and palpitations.  Musculoskeletal:        Left 3rd digit pain.   Skin:  Negative for rash.    Past Medical History:  Diagnosis Date   Heart murmur    childhood   Right lateral epicondylitis 09/16/2015     Social History   Socioeconomic History   Marital status: Single    Spouse name: Not on file   Number of children: Not on file   Years of education: Not on file   Highest education level: Not on file  Occupational History   Not on file  Tobacco Use   Smoking status: Never   Smokeless tobacco: Never  Vaping Use   Vaping Use: Never used  Substance and Sexual Activity   Alcohol use: Yes    Alcohol/week: 0.0 standard drinks of alcohol    Comment: Rare occasinal one galss of wine.   Drug use: No   Sexual activity: Not on file  Other Topics Concern   Not on file  Social History Narrative   Not on file   Social Determinants of Health   Financial Resource Strain: Not on file  Food Insecurity: Not on file  Transportation Needs: Not on file  Physical Activity: Not on file  Stress: Not on file  Social Connections: Not on file  Intimate Partner Violence: Not on file    Past Surgical History:  Procedure Laterality Date   APPENDECTOMY     EYE SURGERY     muscle repositioning   KNEE ARTHROPLASTY     Left   SHOULDER ARTHROSCOPY Left     WISDOM TOOTH EXTRACTION      Family History  Problem Relation Age of Onset   Colon polyps Neg Hx    Colon cancer Neg Hx    Esophageal cancer Neg Hx    Rectal cancer Neg Hx    Stomach cancer Neg Hx     No Known Allergies  Current Outpatient Medications on File Prior to Visit  Medication Sig Dispense Refill   fluticasone (FLONASE) 50 MCG/ACT nasal spray Place 2 sprays into both nostrils daily. 16 g 1   Multiple Vitamins-Minerals (PX MENS MULTIVITAMINS) TABS Take by mouth daily.     Triamcinolone Acetonide (TRIAMCINOLONE 0.1 % CREAM : EUCERIN) CREA Apply 1 application topically 2 (two) times daily as needed. Eczema     No current facility-administered medications on file prior to visit.    BP 112/60   Pulse 65   Ht '5\' 10"'$  (1.778 m)   Wt 182 lb 3.2 oz (82.6 kg)   SpO2 100%   BMI 26.14 kg/m        Objective:   Physical Exam  General- no acute distress. Left hand- no pain on palpation of left hand. No obvious  swelling but pt thinks mild swelling. Left 3rd digit- at base above mcp the finger is tender. No warmth or induration. No fluctuance. No ganglion cyst felt. Palm of hand faint pain below mcp      Assessment & Plan:   Patient Instructions  Left 3rd digit finger pain for one month. Will get xray of 3rd digit today.    Also decided to get hand xray since some pain on palpation of palm.  If no fracture and pain persists then can consider referral to sport med before your trip as [place] could reveal ganglion   Can use ibuprofen if needed for pain.  Follow up date to be determined after xray review.    Mackie Pai, PA-C

## 2022-09-16 ENCOUNTER — Telehealth: Payer: BC Managed Care – PPO | Admitting: Physician Assistant

## 2022-09-16 DIAGNOSIS — H109 Unspecified conjunctivitis: Secondary | ICD-10-CM | POA: Diagnosis not present

## 2022-09-16 MED ORDER — TOBRAMYCIN 0.3 % OP SOLN: 2.0000 [drp] | OPHTHALMIC | 0 refills | Status: AC

## 2022-09-16 NOTE — Progress Notes (Signed)
Virtual Visit Consent   [name], you are scheduled for a virtual visit with a [facility] provider today. Just as with appointments in the office, your consent must be obtained to participate. Your consent will be active for this visit and any virtual visit you may have with one of our providers in the next 365 days. If you have a MyChart account, a copy of this consent can be sent to you electronically.  As this is a virtual visit, video technology does not allow for your provider to perform a traditional examination. This may limit your provider's ability to fully assess your condition. If your provider identifies any concerns that need to be evaluated in person or the need to arrange testing (such as labs, EKG, etc.), we will make arrangements to do so. Although advances in technology are sophisticated, we cannot ensure that it will always work on either your end or our end. If the connection with a video visit is poor, the visit may have to be switched to a telephone visit. With either a video or telephone visit, we are not always able to ensure that we have a secure connection.  By engaging in this virtual visit, you consent to the provision of healthcare and authorize for your insurance to be billed (if applicable) for the services provided during this visit. Depending on your insurance coverage, you may receive a charge related to this service.  I need to obtain your verbal consent now. Are you willing to proceed with your visit today? Kepler Mccabe has provided verbal consent on 09/16/2022 for a virtual visit (video or telephone). Lenise Arena Ward, PA-C  Date: 09/16/2022 8:05 PM  Virtual Visit via Video Note   I, Lenise Arena Ward, connected with  Thomas Skinner  ([id number], 1965-12-26) on 09/16/22 at  7:45 PM EST by a video-enabled telemedicine application and verified that I am speaking with the correct person using two identifiers.  Location: Patient: Virtual Visit Location Patient:  Home Provider: Virtual Visit Location Provider: Home   I discussed the limitations of evaluation and management by telemedicine and the availability of in person appointments. The patient expressed understanding and agreed to proceed.    History of Present Illness: Thomas Skinner is a 56 y.o. who identifies as a male who was assigned male at birth, and is being seen today for   HPI: Conjunctivitis  The current episode started 3 to 5 days ago. The onset was sudden. The problem occurs continuously. The problem has been unchanged. The problem is mild. Nothing relieves the symptoms. Nothing aggravates the symptoms. Associated symptoms include eye discharge and eye redness. Pertinent negatives include no decreased vision, no double vision, no eye itching, no photophobia and no rhinorrhea. The right eye is affected. The eye pain is not associated with movement. The eyelid exhibits no abnormality.    Problems:  Patient Active Problem List   Diagnosis Date Noted   Mallet finger of left hand 01/28/2020   Left elbow pain 10/02/2017   Left knee pain 10/02/2017   Wellness examination 05/15/2015   Allergic reaction 04/03/2015   Current tear knee, lateral meniscus 09/25/2014    Allergies: No Known Allergies Medications:  Current Outpatient Medications:    tobramycin (TOBREX) 0.3 % ophthalmic solution, Place 2 drops into the right eye every 4 (four) hours., Disp: 5 mL, Rfl: 0   fluticasone (FLONASE) 50 MCG/ACT nasal spray, Place 2 sprays into both nostrils daily., Disp: 16 g, Rfl: 1   Multiple Vitamins-Minerals (PX MENS  MULTIVITAMINS) TABS, Take by mouth daily., Disp: , Rfl:    Triamcinolone Acetonide (TRIAMCINOLONE 0.1 % CREAM : EUCERIN) CREA, Apply 1 application topically 2 (two) times daily as needed. Eczema, Disp: , Rfl:   Observations/Objective: Patient is well-developed, well-nourished in no acute distress.  Resting comfortably  at home.  Head is normocephalic, atraumatic.  No labored  breathing.  Speech is clear and coherent with logical content.  Patient is alert and oriented at baseline.    Assessment and Plan: 1. Bacterial conjunctivitis of right eye - tobramycin (TOBREX) 0.3 % ophthalmic solution; Place 2 drops into the right eye every 4 (four) hours.  Dispense: 5 mL; Refill: 0  Discussed use of eyedrops and hand hygiene, cold compress on affected eye.  Advised in person follow up if he develops worsening sx or no improvement with antibiotic eyedrops.   Follow Up Instructions: I discussed the assessment and treatment plan with the patient. The patient was provided an opportunity to ask questions and all were answered. The patient agreed with the plan and demonstrated an understanding of the instructions.  A copy of instructions were sent to the patient via MyChart unless otherwise noted below.     The patient was advised to call back or seek an in-person evaluation if the symptoms worsen or if the condition fails to improve as anticipated.  Time:  I spent 8 minutes with the patient via telehealth technology discussing the above problems/concerns.    Lyndon Code, PA-C

## 2022-09-16 NOTE — Patient Instructions (Signed)
Bacterial Conjunctivitis, Adult Bacterial conjunctivitis is an infection of your conjunctiva. This is the clear membrane that covers the white part of your eye and the inner part of your eyelid. This infection can make your eye: Red or pink. Itchy or irritated. This condition spreads easily from person to person (is contagious) and from one eye to the other eye. What are the causes? This condition is caused by germs (bacteria). You may get the infection if you come into close contact with: A person who has the infection. Items that have germs on them (are contaminated), such as face towels, contact lens solution, or eye makeup. What increases the risk? You are more likely to get this condition if: You have contact with people who have the infection. You wear contact lenses. You have a sinus infection. You have had a recent eye injury or surgery. You have a weak body defense system (immune system). You have dry eyes. What are the signs or symptoms?  Thick, yellowish discharge from the eye. Tearing or watery eyes. Itchy eyes. Burning feeling in your eyes. Eye redness. Swollen eyelids. Blurred vision. How is this treated?  Antibiotic eye drops or ointment. Antibiotic medicine taken by mouth. This is used for infections that do not get better with drops or ointment or that last more than 10 days. Cool, wet cloths placed on the eyes. Artificial tears used 2-6 times a day. Follow these instructions at home: Medicines Take or apply your antibiotic medicine as told by your doctor. Do not stop using it even if you start to feel better. Take or apply over-the-counter and prescription medicines only as told by your doctor. Do not touch your eyelid with the eye-drop bottle or the ointment tube. Managing discomfort Wipe any fluid from your eye with a warm, wet washcloth or a cotton ball. Place a clean, cool, wet cloth on your eye. Do this for 10-20 minutes, 3-4 times a day. General  instructions Do not wear contacts until the infection is gone. Wear glasses until your doctor says it is okay to wear contacts again. Do not wear eye makeup until the infection is gone. Throw away old eye makeup. Change or wash your pillowcase every day. Do not share towels or washcloths. Wash your hands often with soap and water for at least 20 seconds and especially before touching your face or eyes. Use paper towels to dry your hands. Do not touch or rub your eyes. Do not drive or use heavy machinery if your vision is blurred. Contact a doctor if: You have a fever. You do not get better after 10 days. Get help right away if: You have a fever and your symptoms get worse all of a sudden. You have very bad pain when you move your eye. Your face: Hurts. Is red. Is swollen. You have sudden loss of vision. Summary Bacterial conjunctivitis is an infection of your conjunctiva. This infection spreads easily from person to person. Wash your hands often with soap and water for at least 20 seconds and especially before touching your face or eyes. Use paper towels to dry your hands. Take or apply your antibiotic medicine as told by your doctor. Contact a doctor if you have a fever or you do not get better after 10 days. This information is not intended to replace advice given to you by your health care provider. Make sure you discuss any questions you have with your health care provider. Document Revised: 01/13/2021 Document Reviewed: 01/13/2021 Elsevier Patient Education    2023 [employer]  

## 2022-09-16 NOTE — Progress Notes (Deleted)
Virtual Visit Consent   Thomas Skinner, you are scheduled for a virtual visit with a [facility] provider today. Just as with appointments in the office, your consent must be obtained to participate. Your consent will be active for this visit and any virtual visit you may have with one of our providers in the next 365 days. If you have a MyChart account, a copy of this consent can be sent to you electronically.  As this is a virtual visit, video technology does not allow for your provider to perform a traditional examination. This may limit your provider's ability to fully assess your condition. If your provider identifies any concerns that need to be evaluated in person or the need to arrange testing (such as labs, EKG, etc.), we will make arrangements to do so. Although advances in technology are sophisticated, we cannot ensure that it will always work on either your end or our end. If the connection with a video visit is poor, the visit may have to be switched to a telephone visit. With either a video or telephone visit, we are not always able to ensure that we have a secure connection.  By engaging in this virtual visit, you consent to the provision of healthcare and authorize for your insurance to be billed (if applicable) for the services provided during this visit. Depending on your insurance coverage, you may receive a charge related to this service.  I need to obtain your verbal consent now. Are you willing to proceed with your visit today? Thomas Skinner has provided verbal consent on 09/16/2022 for a virtual visit (video or telephone). Mar Daring, PA-C  Date: 09/16/2022 8:04 PM  Virtual Visit via Video Note   I, Mar Daring, connected with  Thomas Skinner  ([id number], [birth year]) on 09/16/22 at  7:45 PM EST by a video-enabled telemedicine application and verified that I am speaking with the correct person using two identifiers.  Location: Patient: {Virtual Visit Location  Patient:25492::"Home"} Provider: {Virtual Visit Location Provider:[id number]::"Office/Clinic"}   I discussed the limitations of evaluation and management by telemedicine and the availability of in person appointments. The patient expressed understanding and agreed to proceed.    History of Present Illness: Thomas Skinner is a 56 y.o. who identifies as a male who was assigned male at birth, and is being seen today for ***.  HPI: HPI  Problems:  Patient Active Problem List   Diagnosis Date Noted   Mallet finger of left hand 01/28/2020   Left elbow pain 10/02/2017   Left knee pain 10/02/2017   Wellness examination 05/15/2015   Allergic reaction 04/03/2015   Current tear knee, lateral meniscus 09/25/2014    Allergies: No Known Allergies Medications:  Current Outpatient Medications:    tobramycin (TOBREX) 0.3 % ophthalmic solution, Place 2 drops into the right eye every 4 (four) hours., Disp: 5 mL, Rfl: 0   fluticasone (FLONASE) 50 MCG/ACT nasal spray, Place 2 sprays into both nostrils daily., Disp: 16 g, Rfl: 1   Multiple Vitamins-Minerals (PX MENS MULTIVITAMINS) TABS, Take by mouth daily., Disp: , Rfl:    Triamcinolone Acetonide (TRIAMCINOLONE 0.1 % CREAM : EUCERIN) CREA, Apply 1 application topically 2 (two) times daily as needed. Eczema, Disp: , Rfl:   Observations/Objective: Patient is well-developed, well-nourished in no acute distress.  Resting comfortably *** at home.  Head is normocephalic, atraumatic.  No labored breathing. *** Speech is clear and coherent with logical content.  Patient is alert and oriented at baseline.  ***  Assessment  and Plan: 1. Bacterial conjunctivitis of right eye - tobramycin (TOBREX) 0.3 % ophthalmic solution; Place 2 drops into the right eye every 4 (four) hours.  Dispense: 5 mL; Refill: 0  ***  Follow Up Instructions: I discussed the assessment and treatment plan with the patient. The patient was provided an opportunity to ask questions and all  were answered. The patient agreed with the plan and demonstrated an understanding of the instructions.  A copy of instructions were sent to the patient via MyChart unless otherwise noted below.   {EMAIL AJO:[id number]::"[facility] has requested to receive PHI (AVS, Work Notes, etc) pertaining to this video visit through e-mail as they are currently without active MyChart. They have voiced understand that email is not considered secure and their health information could be viewed by someone other than the patient. "}  The patient was advised to call back or seek an in-person evaluation if the symptoms worsen or if the condition fails to improve as anticipated.  Time:  I spent *** minutes with the patient via telehealth technology discussing the above problems/concerns.    Lyndon Code, PA-C

## 2023-01-30 ENCOUNTER — Encounter: Payer: Self-pay | Admitting: *Deleted

## 2023-10-27 ENCOUNTER — Ambulatory Visit (INDEPENDENT_AMBULATORY_CARE_PROVIDER_SITE_OTHER): Payer: BC Managed Care – PPO | Admitting: Medical

## 2023-10-27 ENCOUNTER — Ambulatory Visit (HOSPITAL_BASED_OUTPATIENT_CLINIC_OR_DEPARTMENT_OTHER)
Admission: RE | Admit: 2023-10-27 | Discharge: 2023-10-27 | Disposition: A | Discharge status: Home / Self Care | Payer: BC Managed Care – PPO | Source: Ambulatory Visit | Attending: Medical | Admitting: Medical

## 2023-10-27 VITALS — BP 114/62 | HR 63 | Temp 98.2°F | Resp 18 | Ht 70.0 in | Wt 179.6 lb

## 2023-10-27 DIAGNOSIS — M62561 Muscle wasting and atrophy, not elsewhere classified, right lower leg: Secondary | ICD-10-CM

## 2023-10-27 DIAGNOSIS — Z8781 Personal history of (healed) traumatic fracture: Secondary | ICD-10-CM | POA: Insufficient documentation

## 2023-10-27 DIAGNOSIS — M7731 Calcaneal spur, right foot: Secondary | ICD-10-CM | POA: Diagnosis not present

## 2023-10-27 DIAGNOSIS — M25671 Stiffness of right ankle, not elsewhere classified: Secondary | ICD-10-CM | POA: Insufficient documentation

## 2023-10-27 NOTE — Patient Instructions (Signed)
 Right Ankle intermittent stiffness and rare very low  level pain at best with some rt calf trophy History of acute avulsion fracture of the lateral malleolus with probable subsequent change in gait and calf muscle atrophy. No current significant pain, but reports minimal stiffness and discomfort. -Order repeat ankle x-ray to assess healing. -Refer to sports medicine for further evaluation and management.  Altered Gait after injury likely Likely secondary to previous ankle injury and subsequent muscle atrophy. -Address with sports medicine referral.  Follow up in approximate one month for annual wellness exam as has not been done in while.

## 2023-10-27 NOTE — Progress Notes (Signed)
   Subjective:    Patient ID: Thomas Skinner, male    DOB: Apr 26, 1966, 58 y.o.   MRN: [id number]  HPI  Discussed the use of AI scribe software for clinical note transcription with the patient, who gave verbal consent to proceed.  History of Present Illness   The patient, with a history of a right ankle avulsion fracture in June 2023. Today he presents with concerns about right calf atrophy and altered gait. He reports that the injury occurred approximately nine months ago and was followed by a period of international travel during which he did not seek further medical attention.  I did place referral to sport med MD. They attempted to call pt various times. Pt acknowledge/states he thinks he did not call them back.     The patient notes that the right calf muscle has significantly atrophied, and despite attempts at exercise and rehabilitation, the muscle has not fully recovered. He also reports a change in his gait, favoring the right foot, which he believes has contributed to the muscle atrophy.  The patient denies any current pain in the ankle but reports occasional stiffness and maybe very low level pain at times.  He also notes that after periods of rest, the right foot feels significantly different from the left, and the calf muscle appears more atrophied. Despite these changes, the patient reports no significant impact on mobility. He has been attempting various exercises, including calf raises, to improve muscle tone, but notes that the right calf muscle does not seem to activate as it should.  The patient denies any current pain management needs and reports no significant discomfort, only mild occasional discomfort and stiffness. He expresses concern about the ongoing muscle atrophy and altered gait and is seeking further evaluation and treatment options.         Review of Systems See hpi    Objective:   Physical Exam General- NAD Rt ankle- no swelling, no bruise, no redness.  Normal rom. Faint minimal tender at distal tip of lateral malleoulus.  Rt calf- upper 1/3 medial calf appears slight atrophied compared to left side.       Assessment & Plan:   Assessment and Plan    Right Ankle intermittent stiffness and rare very low  level pain at best with some rt calf trophy History of acute avulsion fracture of the lateral malleolus with probable subsequent change in gait and calf muscle atrophy. No current significant pain, but reports minimal stiffness and discomfort. -Order repeat ankle x-ray to assess healing. -Refer to sports medicine for further evaluation and management.  Altered Gait after injury likely Likely secondary to previous ankle injury and subsequent muscle atrophy. -Address with sports medicine referral.   Follow up in approximate one month for annual wellness exam as has not been done in while.

## 2023-10-31 ENCOUNTER — Ambulatory Visit: Payer: BC Managed Care – PPO | Admitting: Orthopedic Surgery

## 2023-11-09 NOTE — Progress Notes (Signed)
Thomas Skinner D.[facility] [address] [place] [address] Phone: [phone]   Assessment and Plan:     1. 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  Mild improvement in muscle mass and strength with patient's home exercises, though continued weakness and muscle atrophy relative to the left - Muscle atrophy may have been due to compensation from prior ankle injury, or could be related to a neurologic impingement. - Recommend conservative rehab therapy with home exercises and physical therapy targeting gastrocnemius muscle.  Physical therapy referral sent today - X-ray reviewed in clinic.  My interpretation: No acute fracture or dislocation.  Healed cortical evidence of prior lateral malleoli avulsion fracture  15 additional minutes spent for educating Therapeutic Home Exercise Program.  This included exercises focusing on stretching, strengthening, with focus on eccentric aspects.   Long term goals include an improvement in range of motion, strength, endurance as well as avoiding reinjury. Patient's frequency would include in 1-2 times a day, 3-5 times a week for a duration of 6-12 weeks. Proper technique shown and discussed handout in great detail with ATC.  All questions were discussed and answered.     Pertinent previous records reviewed include right ankle x-ray 10/27/2023, right ankle x-ray 04/13/2022, internal medicine note 10/27/2023  Follow Up: 6 weeks for reevaluation.  If no improvement or worsening of symptoms, could consider EMG to look for neurologic impingement   Subjective:   I, Thomas Skinner, am serving as a Neurosurgeon for Doctor Richardean Sale  Chief Complaint: right ankle pain   HPI:   11/10/2023 Patient is a 58 year old male with right ankle pain. Patient states that  he sprained his ankle last year.  He states he had a tiny fx. He rolled his ankle again a couple of days ago. He states his foot and ankle are swollen. No meds. He states he has no pain. He just "feels' it more than the left    Relevant Historical Information: None pertinent  Additional pertinent review of systems negative.   Current Outpatient Medications:    fluticasone (FLONASE) 50 MCG/ACT nasal spray, Place 2 sprays into both nostrils daily., Disp: 16 g, Rfl: 1   Multiple Vitamins-Minerals (PX MENS MULTIVITAMINS) TABS, Take by mouth daily., Disp: , Rfl:    tobramycin (TOBREX) 0.3 % ophthalmic solution, Place 2 drops into the right eye every 4 (four) hours., Disp: 5 mL, Rfl: 0   Triamcinolone Acetonide (TRIAMCINOLONE 0.1 % CREAM : EUCERIN) CREA, Apply 1 application topically 2 (two) times daily as needed. Eczema, Disp: , Rfl:    Objective:     Vitals:   11/10/23 1058  Pulse: 89  SpO2: 98%  Weight: 183 lb (83 kg)  Height: 5\' 10"  (1.778 m)      Body mass index is 26.26 kg/m.    Physical Exam:    Gen: Appears well, nad, nontoxic and pleasant Psych: Alert and oriented, appropriate mood and affect Neuro: sensation intact, strength is 5/5 with df/pf/inv/ev, muscle tone wnl Skin: no susupicious lesions or rashes  Right leg/ankle:  Decreased muscle definition of medial gastrocnemius muscle on right compared to left.  Lateral gastrocnemius and soleus muscle appear equal in size bilaterally Increased fatigue with single-leg heel raise on right compared to left No swelling or effusion NTTP over fibular head, lat mal, medial mal, achilles,  navicular, base of 5th, ATFL, CFL, deltoid, calcaneous or midfoot Ankle ROM DF 30, PF 45, inv/ev intact Negative ant drawer, talar tilt, rotation test, squeeze test. Neg thompson No pain with resisted inversion or eversion    Electronically signed by:  Thomas Skinner D.[facility] 11:29 AM 11/10/23

## 2023-11-10 ENCOUNTER — Ambulatory Visit (INDEPENDENT_AMBULATORY_CARE_PROVIDER_SITE_OTHER): Payer: BC Managed Care – PPO | Admitting: Sports Medicine

## 2023-11-10 VITALS — HR 89 | Ht 70.0 in | Wt 183.0 lb

## 2023-11-10 DIAGNOSIS — S8991XA Unspecified injury of right lower leg, initial encounter: Secondary | ICD-10-CM

## 2023-11-10 DIAGNOSIS — S8990XA Unspecified injury of unspecified lower leg, initial encounter: Secondary | ICD-10-CM

## 2023-11-10 NOTE — Addendum Note (Signed)
Addended by: Richardean Sale on: 11/10/2023 11:30 AM   Modules accepted: Level of Service

## 2023-11-10 NOTE — Patient Instructions (Signed)
The issue is to your medial gastrocnemius muscle  Calf HEP PT referral  6 week follow up where we can discuss a nerve conduction study if no improvement

## 2023-11-12 ENCOUNTER — Encounter: Payer: Self-pay | Admitting: Medical

## 2024-01-01 ENCOUNTER — Ambulatory Visit: Admitting: Family

## 2024-01-02 ENCOUNTER — Telehealth (INDEPENDENT_AMBULATORY_CARE_PROVIDER_SITE_OTHER): Admitting: Medical

## 2024-01-02 ENCOUNTER — Ambulatory Visit (HOSPITAL_BASED_OUTPATIENT_CLINIC_OR_DEPARTMENT_OTHER)
Admission: RE | Admit: 2024-01-02 | Discharge: 2024-01-02 | Disposition: A | Discharge status: Home / Self Care | Source: Ambulatory Visit | Attending: Medical | Admitting: Medical

## 2024-01-02 DIAGNOSIS — S6010XA Contusion of unspecified finger with damage to nail, initial encounter: Secondary | ICD-10-CM

## 2024-01-02 DIAGNOSIS — M79645 Pain in left finger(s): Secondary | ICD-10-CM | POA: Insufficient documentation

## 2024-01-02 DIAGNOSIS — S62663A Nondisplaced fracture of distal phalanx of left middle finger, initial encounter for closed fracture: Secondary | ICD-10-CM | POA: Diagnosis not present

## 2024-01-02 DIAGNOSIS — S62639A Displaced fracture of distal phalanx of unspecified finger, initial encounter for closed fracture: Secondary | ICD-10-CM

## 2024-01-02 MED ORDER — FLUTICASONE PROPIONATE 50 MCG/ACT NA SUSP
2.0000 | Freq: Every day | NASAL | 1 refills | Status: AC
Start: 2024-01-02 — End: ?

## 2024-01-02 MED ORDER — LEVOCETIRIZINE DIHYDROCHLORIDE 5 MG PO TABS: 5.0000 mg | ORAL_TABLET | Freq: Every evening | ORAL | 0 refills | Status: AC

## 2024-01-02 NOTE — Progress Notes (Unsigned)
 Virtual Visit via Video Note  I connected with Thomas Skinner on 01/02/24 at  3:20 PM EDT by a video enabled telemedicine application and verified that I am speaking with the correct person using two identifiers.  Location: Patient: home Provider: office   I discussed the limitations of evaluation and management by telemedicine and the availability of in person appointments. The patient expressed understanding and agreed to proceed.  History of Present Illness: Discussed the use of AI scribe software for clinical note transcription with the patient, who gave verbal consent to proceed.  History of Present Illness         Discussed the use of AI scribe software for clinical note transcription with the patient, who gave verbal consent to proceed.  History of Present Illness   Thomas Skinner is a 58 year old male who presents with a left middle finger injury.  Three days ago, he injured his left middle finger by dropping a 60-pound piece of lumber on it while lowering the material. The impact was similar to heavy plastic mallet. He had significant swelling, with the finger appearing twice as swollen but now mild swollen. A subungual hematoma covers about two-thirds of the nail, and the entire finger is bruised. The swelling was most pronounced on the first night, with the skin feeling sensitive. He describes a sharp, burning sensation typical of a small bone injury. He has a history of a previous injury to the same hand, which resulted in a fracture treated with a splint. Currently, he experiences mild pain at the base of the nail, rated as a two on a pain scale.  In addition to the finger injury, he reports nasal congestion, mild coughing, and general body fatigue, which he attributes to recent physical labor. No fever, chills, or sweats. He has a history of mild seasonal allergies, which he believes have worsened over time.        Past Medical History:  Diagnosis Date   Heart murmur     childhood   Right lateral epicondylitis 09/16/2015     Social History   Socioeconomic History   Marital status: Single    Spouse name: Not on file   Number of children: Not on file   Years of education: Not on file   Highest education level: Not on file  Occupational History   Not on file  Tobacco Use   Smoking status: Never   Smokeless tobacco: Never  Vaping Use   Vaping status: Never Used  Substance and Sexual Activity   Alcohol use: Yes    Alcohol/week: 0.0 standard drinks of alcohol    Comment: Rare occasinal one galss of wine.   Drug use: No   Sexual activity: Not on file  Other Topics Concern   Not on file  Social History Narrative   Not on file   Social Drivers of Health   Financial Resource Strain: Not on file  Food Insecurity: Not on file  Transportation Needs: Unknown (10/27/2023)   PRAPARE - Transportation    Lack of Transportation (Medical): No    Lack of Transportation (Non-Medical): Not on file  Physical Activity: Sufficiently Active (10/27/2023)   Exercise Vital Sign    Days of Exercise per Week: 6 days    Minutes of Exercise per Session: 30 min  Stress: No Stress Concern Present (10/27/2023)   [employer] of Occupational Health - Occupational Stress Questionnaire    Feeling of Stress : Only a little  Social Connections: Unknown (02/28/2022)  Received from [facility], [facility]   Social Network    Social Network: Not on file  Intimate Partner Violence: Unknown (01/20/2022)   Received from [facility], [facility]   HITS    Physically Hurt: Not on file    Insult or Talk Down To: Not on file    Threaten Physical Harm: Not on file    Scream or Curse: Not on file    Past Surgical History:  Procedure Laterality Date   APPENDECTOMY     EYE SURGERY     muscle repositioning   KNEE ARTHROPLASTY     Left   SHOULDER ARTHROSCOPY Left    WISDOM TOOTH EXTRACTION      Family History  Problem Relation Age of Onset   Colon polyps Neg Hx     Colon cancer Neg Hx    Esophageal cancer Neg Hx    Rectal cancer Neg Hx    Stomach cancer Neg Hx     No Known Allergies  Current Outpatient Medications on File Prior to Visit  Medication Sig Dispense Refill   fluticasone (FLONASE) 50 MCG/ACT nasal spray Place 2 sprays into both nostrils daily. 16 g 1   Multiple Vitamins-Minerals (PX MENS MULTIVITAMINS) TABS Take by mouth daily.     tobramycin (TOBREX) 0.3 % ophthalmic solution Place 2 drops into the right eye every 4 (four) hours. 5 mL 0   Triamcinolone Acetonide (TRIAMCINOLONE 0.1 % CREAM : EUCERIN) CREA Apply 1 application topically 2 (two) times daily as needed. Eczema     No current facility-administered medications on file prior to visit.    There were no vitals taken for this visit.     Observations/Objective: General-no acute distress, pleasant, oriented. Lungs- on inspection lungs appear unlabored. Neck- no tracheal deviation or jvd on inspection. Neuro- gross motor function appears intact. Left 3rd digit-moderate swelling past dip joint. Diffuse mild bruise. Normal flexion and extension of digit. Subungal hematoma present.   Assessment and Plan: Assessment and Plan          Assessment and Plan    Left middle finger injury Sustained subungual hematoma and bruising from a 60-pound lumber drop. Possible fracture; good flexion and extension suggest intact tendon. Discussed potential nail disfigurement if nail bed injured. - Order x-ray of left third digit to assess for fracture. - Refer to specialist if fracture confirmed. - Discussed potential nail disfigurement if nail bed injured. -fx confirmed by xray. Referred to hand specialist to evaluate nail bed. Also may benefit from trephination of nail.  Allergic rhinitis Nasal congestion, postnasal drainage, and mild coughing consistent with allergic rhinitis. Viral infection unlikely. - Prescribe Flonase nasal spray, two sprays each nostril daily. - Prescribe Xyzal  antihistamine, nightly. - Suggest OTC flu and COVID tests if symptoms change.   Follow up 10-14 days or sooner if needed          Follow Up Instructions:    I discussed the assessment and treatment plan with the patient. The patient was provided an opportunity to ask questions and all were answered. The patient agreed with the plan and demonstrated an understanding of the instructions.   The patient was advised to call back or seek an in-person evaluation if the symptoms worsen or if the condition fails to improve as anticipated.   Esperanza Richters, PA-C

## 2024-01-03 ENCOUNTER — Encounter: Payer: Self-pay | Admitting: Medical

## 2024-01-03 NOTE — Patient Instructions (Signed)
 Left middle finger injury Sustained subungual hematoma and bruising from a 60-pound lumber drop. Possible fracture; good flexion and extension suggest intact tendon. Discussed potential nail disfigurement if nail bed injured. - Order x-ray of left third digit to assess for fracture. - Refer to specialist if fracture confirmed. - Discussed potential nail disfigurement if nail bed injured. -fx confirmed by xray. Referred to hand specialist to evaluate nail bed. Also may benefit from trephination of nail.  Allergic rhinitis Nasal congestion, postnasal drainage, and mild coughing consistent with allergic rhinitis. Viral infection unlikely. - Prescribe Flonase nasal spray, two sprays each nostril daily. - Prescribe Xyzal antihistamine, nightly. - Suggest OTC flu and COVID tests if symptoms change.   Follow up 10-14 days or sooner if needed

## 2024-02-19 ENCOUNTER — Ambulatory Visit: Payer: Self-pay | Admitting: Medical

## 2024-02-19 NOTE — Telephone Encounter (Signed)
 Chief Complaint: Stepped on screw on right big toe on Saturday  Symptoms: Pain 2/10 intermittent  Disposition: [x] Appointment (In office)  Additional Notes: The screw went through boot and went into skin about 0.5 in. Patient scheduled for an appointment tomorrow at PCP office. This RN educated pt on home care, new-worsening symptoms, when to call back/seek emergent care. Pt verbalized understanding and agrees to plan.    Copied from CRM [id number]. Topic: Clinical - Red Word Triage >> Feb 19, 2024  5:36 PM Magdalene [employer] wrote: Red Word that prompted transfer to Nurse Triage: Injury. Patient has a nail go through his foot on Saturday 02/17/24. Reason for Disposition  No prior tetanus shots (or is not fully vaccinated)  Answer Assessment - Initial Assessment Questions 1. LOCATION: "Where is the puncture located?"      Right big toe  2. OBJECT: "What was the object that punctured the skin?"      Screw 3. DEPTH: "How deep do you think the puncture goes?"      0.5 inch 4. ONSET: "When did the injury occur?" (Minutes or hours)     Saturday 5. PAIN: "Is it painful?" If Yes, ask: "How bad is the pain?"  (Scale 1-10; or mild, moderate, severe)     2/10 intermittent 6. TETANUS: "When was the last tetanus booster?"     Never  Protocols used: Puncture Wound-A-AH

## 2024-02-20 ENCOUNTER — Ambulatory Visit (INDEPENDENT_AMBULATORY_CARE_PROVIDER_SITE_OTHER): Admitting: Medical

## 2024-02-20 VITALS — BP 124/60 | HR 58 | Resp 18 | Ht 70.0 in | Wt 183.0 lb

## 2024-02-20 DIAGNOSIS — M79671 Pain in right foot: Secondary | ICD-10-CM | POA: Diagnosis not present

## 2024-02-20 DIAGNOSIS — S91331A Puncture wound without foreign body, right foot, initial encounter: Secondary | ICD-10-CM

## 2024-02-20 DIAGNOSIS — Z23 Encounter for immunization: Secondary | ICD-10-CM

## 2024-02-20 NOTE — Patient Instructions (Addendum)
 Puncture wound of foot Puncture wound from a self-tapping screw with potential contamination. No infection signs. Screw intact, no fragments. - Administer tetanus prophylaxis today. - Monitor wound for color, redness, or swelling. - If pain persists next week, obtain foot x-ray.(future xray order placed) - Advise to report changes or concerns.  Follow up late Summer or Early fall wellness exam(sooner if needed)

## 2024-02-20 NOTE — Progress Notes (Signed)
   Subjective:    Patient ID: Thomas Skinner, male    DOB: February 09, 1966, 58 y.o.   MRN: [id number]  HPI Thomas Skinner is a 58 year old male who presents with a puncture wound from a self-tapping screw.  He sustained a puncture wound on Saturday when a self-tapping screw penetrated his boot and foot while working on a deck. The screw, described as having a spade in the front, remained intact in the board after the incident. The injury occurred while trimming grass, inadvertently covering the screw with grass clippings.  Initially, the wound was sore, particularly around the tendon, but the pain has significantly improved. He rates the current pain as less than a 'one' on a scale of ten, even when pressure is applied. The area was slightly warm yesterday but has since improved. No significant increase in pain with movement of the toe.  The screw was rusty and had been in the grass and dirt, raising concerns about tetanus risk. He last received a tetanus shot in 2016, which is nearly ten years ago.   Review of Systems See hpi    Objective:   Physical Exam  General- No acute distress. Pleasant patient. Rt foot- small puncture wound of rt distal 1st metatarsal. Small puncture wound metatarsal head. No redness, no dc, no swelling.        Assessment & Plan:   Patient Instructions  Puncture wound of foot Puncture wound from a self-tapping screw with potential contamination. No infection signs. Screw intact, no fragments. - Administer tetanus prophylaxis today. - Monitor wound for color, redness, or swelling. - If pain persists next week, obtain foot x-ray.(future xray order placed) - Advise to report changes or concerns.  Follow up late Summer or Early fall wellness exam(sooner if needed)   Nora Sabey, PA-C

## 2024-03-25 ENCOUNTER — Telehealth: Payer: Self-pay | Admitting: Medical

## 2024-03-25 NOTE — Telephone Encounter (Signed)
 Copied from CRM [id number]. Topic: General - Other >> Mar 25, 2024 10:58 AM Chuck Crater wrote: Reason for CRM: Patient has questions about physical before he schedule one. He is requesting a callback from doctor.

## 2024-03-26 NOTE — Telephone Encounter (Signed)
 Pt called and confirmed appt.

## 2024-03-27 ENCOUNTER — Ambulatory Visit (INDEPENDENT_AMBULATORY_CARE_PROVIDER_SITE_OTHER): Admitting: Medical

## 2024-03-27 ENCOUNTER — Encounter: Admitting: Medical

## 2024-03-27 VITALS — BP 120/78 | HR 50 | Temp 98.2°F | Resp 18 | Ht 70.0 in | Wt 179.6 lb

## 2024-03-27 DIAGNOSIS — R5383 Other fatigue: Secondary | ICD-10-CM | POA: Diagnosis not present

## 2024-03-27 DIAGNOSIS — Z125 Encounter for screening for malignant neoplasm of prostate: Secondary | ICD-10-CM

## 2024-03-27 DIAGNOSIS — Z23 Encounter for immunization: Secondary | ICD-10-CM | POA: Diagnosis not present

## 2024-03-27 DIAGNOSIS — E7800 Pure hypercholesterolemia, unspecified: Secondary | ICD-10-CM | POA: Diagnosis not present

## 2024-03-27 DIAGNOSIS — Z0000 Encounter for general adult medical examination without abnormal findings: Secondary | ICD-10-CM

## 2024-03-27 LAB — CBC WITH DIFFERENTIAL/PLATELET
Absolute Lymphocytes: 1579 {cells}/uL (ref 850–3900)
Absolute Monocytes: 402 {cells}/uL (ref 200–950)
Basophils Absolute: 61 {cells}/uL (ref 0–200)
Basophils Relative: 1.1 %
Eosinophils Absolute: 160 {cells}/uL (ref 15–500)
Eosinophils Relative: 2.9 %
HCT: 43.9 % (ref 38.5–50.0)
Hemoglobin: 14.5 g/dL (ref 13.2–17.1)
MCH: 33.5 pg — ABNORMAL HIGH (ref 27.0–33.0)
MCHC: 33 g/dL (ref 32.0–36.0)
MCV: 101.4 fL — ABNORMAL HIGH (ref 80.0–100.0)
MPV: 11.9 fL (ref 7.5–12.5)
Monocytes Relative: 7.3 %
Neutro Abs: 3300 {cells}/uL (ref 1500–7800)
Neutrophils Relative %: 60 %
Platelets: 204 10*3/uL (ref 140–400)
RBC: 4.33 10*6/uL (ref 4.20–5.80)
RDW: 12 % (ref 11.0–15.0)
Total Lymphocyte: 28.7 %
WBC: 5.5 10*3/uL (ref 3.8–10.8)

## 2024-03-27 LAB — LIPID PANEL
Cholesterol: 197 mg/dL (ref <200)
HDL: 66 mg/dL (ref >=40)
LDL Cholesterol (Calc): 117 mg/dL — ABNORMAL HIGH
Non-HDL Cholesterol (Calc): 131 mg/dL — ABNORMAL HIGH (ref <130)
Total CHOL/HDL Ratio: 3 (calc) (ref <5.0)
Triglycerides: 47 mg/dL (ref <150)

## 2024-03-27 LAB — COMPREHENSIVE METABOLIC PANEL WITH GFR
AG Ratio: 1.7 (calc) (ref 1.0–2.5)
ALT: 41 U/L (ref 9–46)
AST: 24 U/L (ref 10–35)
Albumin: 4.5 g/dL (ref 3.6–5.1)
Alkaline phosphatase (APISO): 33 U/L — ABNORMAL LOW (ref 35–144)
BUN/Creatinine Ratio: 23 (calc) — ABNORMAL HIGH (ref 6–22)
BUN: 29 mg/dL — ABNORMAL HIGH (ref 7–25)
CO2: 28 mmol/L (ref 20–32)
Calcium: 9.8 mg/dL (ref 8.6–10.3)
Chloride: 103 mmol/L (ref 98–110)
Creat: 1.25 mg/dL (ref 0.70–1.30)
Globulin: 2.7 g/dL (ref 1.9–3.7)
Glucose, Bld: 99 mg/dL (ref 65–99)
Potassium: 4.5 mmol/L (ref 3.5–5.3)
Sodium: 139 mmol/L (ref 135–146)
Total Bilirubin: 0.7 mg/dL (ref 0.2–1.2)
Total Protein: 7.2 g/dL (ref 6.1–8.1)
eGFR: 68 mL/min/{1.73_m2} (ref >=60)

## 2024-03-27 LAB — VITAMIN B12: Vitamin B-12: 1208 pg/mL — ABNORMAL HIGH (ref 200–1100)

## 2024-03-27 LAB — TSH: TSH: 1.29 m[IU]/L (ref 0.40–4.50)

## 2024-03-27 LAB — PSA: PSA: 0.48 ng/mL (ref <=4.00)

## 2024-03-27 LAB — T4, FREE: Free T4: 1.4 ng/dL (ref 0.8–1.8)

## 2024-03-27 NOTE — Progress Notes (Signed)
 Subjective:    Patient ID: Thomas Skinner, male    DOB: 04-25-1966, 58 y.o.   MRN: [id number]  HPI  Pt in for wellness exam.  Pt is not fasting. Works at [employer]. Pt continuing to exercise 5 days a week. None smoker and no alcohol. 1 cup coffee a day.  Pt agrees to shingrix vaccine.   Pt up to date on colonoscopy.  Will repeat psa today.     Review of Systems  Constitutional:  Positive for fatigue. Negative for chills and fever.       Fatigue may be work related. And not sleeping well due to excessive work. 60 hour and maybe more a week at times. Healthy diet.  HENT:  Negative for congestion and ear pain.   Respiratory:  Negative for cough, chest tightness, shortness of breath and wheezing.   Cardiovascular:  Negative for chest pain and palpitations.  Gastrointestinal:  Negative for abdominal pain, constipation and nausea.  Musculoskeletal:  Negative for back pain and myalgias.  Skin:  Negative for rash.  Neurological:  Negative for dizziness, light-headedness and numbness.  Hematological:  Negative for adenopathy. Does not bruise/bleed easily.  Psychiatric/Behavioral:  Negative for behavioral problems, decreased concentration, sleep disturbance and suicidal ideas. The patient is not nervous/anxious.        Stress recently.    Past Medical History:  Diagnosis Date   Heart murmur    childhood   Right lateral epicondylitis 09/16/2015     Social History   Socioeconomic History   Marital status: Single    Spouse name: Not on file   Number of children: Not on file   Years of education: Not on file   Highest education level: Not on file  Occupational History   Not on file  Tobacco Use   Smoking status: Never   Smokeless tobacco: Never  Vaping Use   Vaping status: Never Used  Substance and Sexual Activity   Alcohol use: Yes    Alcohol/week: 0.0 standard drinks of alcohol    Comment: Rare occasinal one galss of wine.   Drug use: No   Sexual activity: Not on file   Other Topics Concern   Not on file  Social History Narrative   Not on file   Social Drivers of Health   Financial Resource Strain: Not on file  Food Insecurity: Not on file  Transportation Needs: Unknown (10/27/2023)   PRAPARE - Transportation    Lack of Transportation (Medical): No    Lack of Transportation (Non-Medical): Not on file  Physical Activity: Sufficiently Active (10/27/2023)   Exercise Vital Sign    Days of Exercise per Week: 6 days    Minutes of Exercise per Session: 30 min  Stress: No Stress Concern Present (10/27/2023)   [employer] of Occupational Health - Occupational Stress Questionnaire    Feeling of Stress : Only a little  Social Connections: Unknown (02/28/2022)   Received from [facility]   Social Network    Social Network: Not on file  Intimate Partner Violence: Unknown (01/20/2022)   Received from [facility], [facility]   HITS    Physically Hurt: Not on file    Insult or Talk Down To: Not on file    Threaten Physical Harm: Not on file    Scream or Curse: Not on file    Past Surgical History:  Procedure Laterality Date   APPENDECTOMY     EYE SURGERY     muscle repositioning  KNEE ARTHROPLASTY     Left   SHOULDER ARTHROSCOPY Left    WISDOM TOOTH EXTRACTION      Family History  Problem Relation Age of Onset   Colon polyps Neg Hx    Colon cancer Neg Hx    Esophageal cancer Neg Hx    Rectal cancer Neg Hx    Stomach cancer Neg Hx     No Known Allergies  Current Outpatient Medications on File Prior to Visit  Medication Sig Dispense Refill   fluticasone  (FLONASE ) 50 MCG/ACT nasal spray Place 2 sprays into both nostrils daily. 16 g 1   fluticasone  (FLONASE ) 50 MCG/ACT nasal spray Place 2 sprays into both nostrils daily. 16 g 1   levocetirizine (XYZAL ) 5 MG tablet Take 1 tablet (5 mg total) by mouth every evening. 30 tablet 0   Multiple Vitamins-Minerals (PX MENS MULTIVITAMINS) TABS Take by mouth daily.      tobramycin  (TOBREX ) 0.3 % ophthalmic solution Place 2 drops into the right eye every 4 (four) hours. 5 mL 0   Triamcinolone Acetonide (TRIAMCINOLONE 0.1 % CREAM : EUCERIN) CREA Apply 1 application topically 2 (two) times daily as needed. Eczema     No current facility-administered medications on file prior to visit.    BP 120/78   Pulse (!) 50   Temp 98.2 F (36.8 C)   Resp 18   Ht 5' 10 (1.778 m)   Wt 179 lb 9.6 oz (81.5 kg)   SpO2 98%   BMI 25.77 kg/m        Objective:   Physical Exam  General Mental Status- Alert. General Appearance- Not in acute distress.   Skin General: Color- Normal Color. Moisture- Normal Moisture.  Neck Carotid Arteries- Normal color. Moisture- Normal Moisture. No carotid bruits. No JVD.  Chest and Lung Exam Auscultation: Breath Sounds:-CTA  Cardiovascular Auscultation:Rythm- RRR Murmurs & Other Heart Sounds:Auscultation of the heart reveals- No Murmurs.  Abdomen Inspection:-Inspeection Normal. Palpation/Percussion:Note:No mass. Palpation and Percussion of the abdomen reveal- Non Tender, Non Distended + BS, no rebound or guarding.  Neurologic Cranial Nerve exam:- CN III-XII intact(No nystagmus), symmetric smile. Strength:- 5/5 equal and symmetric strength both upper and lower extremities.       Assessment & Plan:   Patient Instructions  For you wellness exam today I have ordered cbc, cmp and  lipid panel.  For recent fatigue tsh, t4 and b12 level  Vaccine shingrix today  Recommend exercise and healthy diet.  We will let you know lab results as they come in.  Follow up date appointment will be determined after lab review.       Maezie Justin, PA-C

## 2024-03-27 NOTE — Patient Instructions (Addendum)
 For you wellness exam today I have ordered cbc, cmp and  lipid panel.  For recent fatigue tsh, t4 and b12 level  Vaccine shingrix today  Recommend exercise and healthy diet.  We will let you know lab results as they come in.  Follow up date appointment will be determined after lab review.     Preventive Care 18-56 Years Old, Male Preventive care refers to lifestyle choices and visits with your health care provider that can promote health and wellness. Preventive care visits are also called wellness exams. What can I expect for my preventive care visit? Counseling During your preventive care visit, your health care provider may ask about your: Medical history, including: Past medical problems. Family medical history. Current health, including: Emotional well-being. Home life and relationship well-being. Sexual activity. Lifestyle, including: Alcohol, nicotine or tobacco, and drug use. Access to firearms. Diet, exercise, and sleep habits. Safety issues such as seatbelt and bike helmet use. Sunscreen use. Work and work Astronomer. Physical exam Your health care provider will check your: Height and weight. These may be used to calculate your BMI (body mass index). BMI is a measurement that tells if you are at a healthy weight. Waist circumference. This measures the distance around your waistline. This measurement also tells if you are at a healthy weight and may help predict your risk of certain diseases, such as type 2 diabetes and high blood pressure. Heart rate and blood pressure. Body temperature. Skin for abnormal spots. What immunizations do I need?  Vaccines are usually given at various ages, according to a schedule. Your health care provider will recommend vaccines for you based on your age, medical history, and lifestyle or other factors, such as travel or where you work. What tests do I need? Screening Your health care provider may recommend screening tests for  certain conditions. This may include: Lipid and cholesterol levels. Diabetes screening. This is done by checking your blood sugar (glucose) after you have not eaten for a while (fasting). Hepatitis B test. Hepatitis C test. HIV (human immunodeficiency virus) test. STI (sexually transmitted infection) testing, if you are at risk. Lung cancer screening. Prostate cancer screening. Colorectal cancer screening. Talk with your health care provider about your test results, treatment options, and if necessary, the need for more tests. Follow these instructions at home: Eating and drinking  Eat a diet that includes fresh fruits and vegetables, whole grains, lean protein, and low-fat dairy products. Take vitamin and mineral supplements as recommended by your health care provider. Do not drink alcohol if your health care provider tells you not to drink. If you drink alcohol: Limit how much you have to 0-2 drinks a day. Know how much alcohol is in your drink. In the [place] one drink equals one 12 oz bottle of beer (355 mL), one 5 oz glass of wine (148 mL), or one 1 oz glass of hard liquor (44 mL). Lifestyle Brush your teeth every morning and night with fluoride toothpaste. Floss one time each day. Exercise for at least 30 minutes 5 or more days each week. Do not use any products that contain nicotine or tobacco. These products include cigarettes, chewing tobacco, and vaping devices, such as e-cigarettes. If you need help quitting, ask your health care provider. Do not use drugs. If you are sexually active, practice safe sex. Use a condom or other form of protection to prevent STIs. Take aspirin only as told by your health care provider. Make sure that you understand how much to  take and what form to take. Work with your health care provider to find out whether it is safe and beneficial for you to take aspirin daily. Find healthy ways to manage stress, such as: Meditation, yoga, or listening to  music. Journaling. Talking to a trusted person. Spending time with friends and family. Minimize exposure to UV radiation to reduce your risk of skin cancer. Safety Always wear your seat belt while driving or riding in a vehicle. Do not drive: If you have been drinking alcohol. Do not ride with someone who has been drinking. When you are tired or distracted. While texting. If you have been using any mind-altering substances or drugs. Wear a helmet and other protective equipment during sports activities. If you have firearms in your house, make sure you follow all gun safety procedures. What's next? Go to your health care provider once a year for an annual wellness visit. Ask your health care provider how often you should have your eyes and teeth checked. Stay up to date on all vaccines. This information is not intended to replace advice given to you by your health care provider. Make sure you discuss any questions you have with your health care provider. Document Revised: 03/31/2021 Document Reviewed: 03/31/2021 Elsevier Patient Education  2024 [employer].

## 2024-03-27 NOTE — Addendum Note (Signed)
 Addended by: Ruthe Coward on: 03/27/2024 08:56 AM   Modules accepted: Orders

## 2024-03-28 ENCOUNTER — Ambulatory Visit: Payer: Self-pay | Admitting: Medical

## 2024-08-19 ENCOUNTER — Encounter: Payer: Self-pay | Admitting: Radiology
